# Patient Record
Sex: Female | Born: 1950 | Race: White | Hispanic: No | Marital: Single | Smoking: Never smoker
Health system: Southern US, Community
[De-identification: ages and names within clinical notes are randomized; demographics above are authoritative.]

## PROBLEM LIST (undated history)

## (undated) DIAGNOSIS — K5190 Ulcerative colitis, unspecified, without complications: Secondary | ICD-10-CM

## (undated) DIAGNOSIS — C44721 Squamous cell carcinoma of skin of unspecified lower limb, including hip: Secondary | ICD-10-CM

## (undated) HISTORY — DX: Ulcerative colitis, unspecified, without complications: K51.90

## (undated) HISTORY — DX: Squamous cell carcinoma of skin of unspecified lower limb, including hip: C44.721

## (undated) HISTORY — PX: CHOLECYSTECTOMY: SHX55

## (undated) HISTORY — PX: BREAST EXCISIONAL BIOPSY: SUR124

## (undated) HISTORY — PX: TONSILLECTOMY: SUR1361

## (undated) HISTORY — PX: EYE SURGERY: SHX253

---

## 2005-08-11 ENCOUNTER — Ambulatory Visit: Payer: Self-pay | Admitting: Cardiology

## 2015-08-04 DIAGNOSIS — C44219 Basal cell carcinoma of skin of left ear and external auricular canal: Secondary | ICD-10-CM | POA: Diagnosis not present

## 2015-08-21 DIAGNOSIS — R197 Diarrhea, unspecified: Secondary | ICD-10-CM | POA: Diagnosis not present

## 2015-08-21 DIAGNOSIS — R198 Other specified symptoms and signs involving the digestive system and abdomen: Secondary | ICD-10-CM | POA: Diagnosis not present

## 2015-08-21 DIAGNOSIS — R109 Unspecified abdominal pain: Secondary | ICD-10-CM | POA: Diagnosis not present

## 2015-08-24 DIAGNOSIS — R197 Diarrhea, unspecified: Secondary | ICD-10-CM | POA: Diagnosis not present

## 2015-08-24 DIAGNOSIS — R198 Other specified symptoms and signs involving the digestive system and abdomen: Secondary | ICD-10-CM | POA: Diagnosis not present

## 2015-08-28 DIAGNOSIS — R198 Other specified symptoms and signs involving the digestive system and abdomen: Secondary | ICD-10-CM | POA: Diagnosis not present

## 2015-08-28 DIAGNOSIS — R197 Diarrhea, unspecified: Secondary | ICD-10-CM | POA: Diagnosis not present

## 2015-08-28 DIAGNOSIS — R109 Unspecified abdominal pain: Secondary | ICD-10-CM | POA: Diagnosis not present

## 2015-09-05 DIAGNOSIS — Z881 Allergy status to other antibiotic agents status: Secondary | ICD-10-CM | POA: Diagnosis not present

## 2015-09-05 DIAGNOSIS — R197 Diarrhea, unspecified: Secondary | ICD-10-CM | POA: Diagnosis not present

## 2015-09-08 DIAGNOSIS — R197 Diarrhea, unspecified: Secondary | ICD-10-CM | POA: Diagnosis not present

## 2015-09-08 DIAGNOSIS — R42 Dizziness and giddiness: Secondary | ICD-10-CM | POA: Diagnosis not present

## 2015-09-08 DIAGNOSIS — Z299 Encounter for prophylactic measures, unspecified: Secondary | ICD-10-CM | POA: Diagnosis not present

## 2015-09-08 DIAGNOSIS — M249 Joint derangement, unspecified: Secondary | ICD-10-CM | POA: Diagnosis not present

## 2015-09-08 DIAGNOSIS — Z6823 Body mass index (BMI) 23.0-23.9, adult: Secondary | ICD-10-CM | POA: Diagnosis not present

## 2015-09-08 DIAGNOSIS — Z789 Other specified health status: Secondary | ICD-10-CM | POA: Diagnosis not present

## 2015-09-18 DIAGNOSIS — R109 Unspecified abdominal pain: Secondary | ICD-10-CM | POA: Diagnosis not present

## 2015-09-18 DIAGNOSIS — R197 Diarrhea, unspecified: Secondary | ICD-10-CM | POA: Diagnosis not present

## 2015-09-18 DIAGNOSIS — R748 Abnormal levels of other serum enzymes: Secondary | ICD-10-CM | POA: Diagnosis not present

## 2015-09-18 DIAGNOSIS — R198 Other specified symptoms and signs involving the digestive system and abdomen: Secondary | ICD-10-CM | POA: Diagnosis not present

## 2015-09-28 DIAGNOSIS — R109 Unspecified abdominal pain: Secondary | ICD-10-CM | POA: Diagnosis not present

## 2015-09-29 DIAGNOSIS — E7800 Pure hypercholesterolemia, unspecified: Secondary | ICD-10-CM | POA: Diagnosis not present

## 2015-09-29 DIAGNOSIS — N390 Urinary tract infection, site not specified: Secondary | ICD-10-CM | POA: Diagnosis not present

## 2015-09-29 DIAGNOSIS — Z299 Encounter for prophylactic measures, unspecified: Secondary | ICD-10-CM | POA: Diagnosis not present

## 2015-09-29 DIAGNOSIS — R197 Diarrhea, unspecified: Secondary | ICD-10-CM | POA: Diagnosis not present

## 2015-10-05 DIAGNOSIS — N200 Calculus of kidney: Secondary | ICD-10-CM | POA: Diagnosis not present

## 2015-10-05 DIAGNOSIS — Z87440 Personal history of urinary (tract) infections: Secondary | ICD-10-CM | POA: Diagnosis not present

## 2015-10-05 DIAGNOSIS — N281 Cyst of kidney, acquired: Secondary | ICD-10-CM | POA: Diagnosis not present

## 2015-10-21 DIAGNOSIS — Z789 Other specified health status: Secondary | ICD-10-CM | POA: Diagnosis not present

## 2015-10-21 DIAGNOSIS — R197 Diarrhea, unspecified: Secondary | ICD-10-CM | POA: Diagnosis not present

## 2015-10-21 DIAGNOSIS — J019 Acute sinusitis, unspecified: Secondary | ICD-10-CM | POA: Diagnosis not present

## 2015-10-21 DIAGNOSIS — Z299 Encounter for prophylactic measures, unspecified: Secondary | ICD-10-CM | POA: Diagnosis not present

## 2015-11-11 DIAGNOSIS — Z01419 Encounter for gynecological examination (general) (routine) without abnormal findings: Secondary | ICD-10-CM | POA: Diagnosis not present

## 2015-11-11 DIAGNOSIS — Z6824 Body mass index (BMI) 24.0-24.9, adult: Secondary | ICD-10-CM | POA: Diagnosis not present

## 2015-11-19 DIAGNOSIS — Z1231 Encounter for screening mammogram for malignant neoplasm of breast: Secondary | ICD-10-CM | POA: Diagnosis not present

## 2015-12-31 DIAGNOSIS — C44311 Basal cell carcinoma of skin of nose: Secondary | ICD-10-CM | POA: Diagnosis not present

## 2016-02-25 DIAGNOSIS — Z85828 Personal history of other malignant neoplasm of skin: Secondary | ICD-10-CM | POA: Diagnosis not present

## 2016-02-25 DIAGNOSIS — L814 Other melanin hyperpigmentation: Secondary | ICD-10-CM | POA: Diagnosis not present

## 2016-02-25 DIAGNOSIS — L821 Other seborrheic keratosis: Secondary | ICD-10-CM | POA: Diagnosis not present

## 2016-02-25 DIAGNOSIS — L570 Actinic keratosis: Secondary | ICD-10-CM | POA: Diagnosis not present

## 2016-02-25 DIAGNOSIS — D1801 Hemangioma of skin and subcutaneous tissue: Secondary | ICD-10-CM | POA: Diagnosis not present

## 2016-03-15 DIAGNOSIS — R93422 Abnormal radiologic findings on diagnostic imaging of left kidney: Secondary | ICD-10-CM | POA: Diagnosis not present

## 2016-04-05 DIAGNOSIS — J329 Chronic sinusitis, unspecified: Secondary | ICD-10-CM | POA: Diagnosis not present

## 2016-04-05 DIAGNOSIS — E7800 Pure hypercholesterolemia, unspecified: Secondary | ICD-10-CM | POA: Diagnosis not present

## 2016-04-05 DIAGNOSIS — Z299 Encounter for prophylactic measures, unspecified: Secondary | ICD-10-CM | POA: Diagnosis not present

## 2016-04-11 DIAGNOSIS — R351 Nocturia: Secondary | ICD-10-CM | POA: Diagnosis not present

## 2016-04-11 DIAGNOSIS — D49512 Neoplasm of unspecified behavior of left kidney: Secondary | ICD-10-CM | POA: Diagnosis not present

## 2016-04-11 DIAGNOSIS — R3129 Other microscopic hematuria: Secondary | ICD-10-CM | POA: Diagnosis not present

## 2016-04-13 DIAGNOSIS — H9313 Tinnitus, bilateral: Secondary | ICD-10-CM | POA: Diagnosis not present

## 2016-04-13 DIAGNOSIS — Z23 Encounter for immunization: Secondary | ICD-10-CM | POA: Diagnosis not present

## 2016-04-13 DIAGNOSIS — H9312 Tinnitus, left ear: Secondary | ICD-10-CM | POA: Diagnosis not present

## 2016-04-13 DIAGNOSIS — H903 Sensorineural hearing loss, bilateral: Secondary | ICD-10-CM | POA: Diagnosis not present

## 2016-06-07 DIAGNOSIS — Z0000 Encounter for general adult medical examination without abnormal findings: Secondary | ICD-10-CM | POA: Diagnosis not present

## 2016-06-07 DIAGNOSIS — Z7189 Other specified counseling: Secondary | ICD-10-CM | POA: Diagnosis not present

## 2016-06-07 DIAGNOSIS — Z1389 Encounter for screening for other disorder: Secondary | ICD-10-CM | POA: Diagnosis not present

## 2016-06-07 DIAGNOSIS — E559 Vitamin D deficiency, unspecified: Secondary | ICD-10-CM | POA: Diagnosis not present

## 2016-06-07 DIAGNOSIS — Z1211 Encounter for screening for malignant neoplasm of colon: Secondary | ICD-10-CM | POA: Diagnosis not present

## 2016-06-07 DIAGNOSIS — R5383 Other fatigue: Secondary | ICD-10-CM | POA: Diagnosis not present

## 2016-06-07 DIAGNOSIS — E7800 Pure hypercholesterolemia, unspecified: Secondary | ICD-10-CM | POA: Diagnosis not present

## 2016-06-07 DIAGNOSIS — Z6824 Body mass index (BMI) 24.0-24.9, adult: Secondary | ICD-10-CM | POA: Diagnosis not present

## 2016-06-07 DIAGNOSIS — Z299 Encounter for prophylactic measures, unspecified: Secondary | ICD-10-CM | POA: Diagnosis not present

## 2016-06-24 DIAGNOSIS — K529 Noninfective gastroenteritis and colitis, unspecified: Secondary | ICD-10-CM | POA: Diagnosis not present

## 2016-06-24 DIAGNOSIS — K625 Hemorrhage of anus and rectum: Secondary | ICD-10-CM | POA: Diagnosis not present

## 2016-06-27 DIAGNOSIS — E7800 Pure hypercholesterolemia, unspecified: Secondary | ICD-10-CM | POA: Diagnosis not present

## 2016-06-27 DIAGNOSIS — R197 Diarrhea, unspecified: Secondary | ICD-10-CM | POA: Diagnosis not present

## 2016-06-27 DIAGNOSIS — J111 Influenza due to unidentified influenza virus with other respiratory manifestations: Secondary | ICD-10-CM | POA: Diagnosis not present

## 2016-06-27 DIAGNOSIS — R509 Fever, unspecified: Secondary | ICD-10-CM | POA: Diagnosis not present

## 2016-06-27 DIAGNOSIS — Z789 Other specified health status: Secondary | ICD-10-CM | POA: Diagnosis not present

## 2016-06-27 DIAGNOSIS — Z299 Encounter for prophylactic measures, unspecified: Secondary | ICD-10-CM | POA: Diagnosis not present

## 2016-06-27 DIAGNOSIS — Z6824 Body mass index (BMI) 24.0-24.9, adult: Secondary | ICD-10-CM | POA: Diagnosis not present

## 2016-07-11 DIAGNOSIS — K5289 Other specified noninfective gastroenteritis and colitis: Secondary | ICD-10-CM | POA: Diagnosis not present

## 2016-07-11 DIAGNOSIS — K529 Noninfective gastroenteritis and colitis, unspecified: Secondary | ICD-10-CM | POA: Diagnosis not present

## 2016-07-11 DIAGNOSIS — K625 Hemorrhage of anus and rectum: Secondary | ICD-10-CM | POA: Diagnosis not present

## 2016-07-12 DIAGNOSIS — E2839 Other primary ovarian failure: Secondary | ICD-10-CM | POA: Diagnosis not present

## 2016-07-14 DIAGNOSIS — Z789 Other specified health status: Secondary | ICD-10-CM | POA: Diagnosis not present

## 2016-07-14 DIAGNOSIS — Z299 Encounter for prophylactic measures, unspecified: Secondary | ICD-10-CM | POA: Diagnosis not present

## 2016-07-14 DIAGNOSIS — Z6824 Body mass index (BMI) 24.0-24.9, adult: Secondary | ICD-10-CM | POA: Diagnosis not present

## 2016-07-14 DIAGNOSIS — R197 Diarrhea, unspecified: Secondary | ICD-10-CM | POA: Diagnosis not present

## 2016-07-14 DIAGNOSIS — Z713 Dietary counseling and surveillance: Secondary | ICD-10-CM | POA: Diagnosis not present

## 2016-07-14 DIAGNOSIS — E7800 Pure hypercholesterolemia, unspecified: Secondary | ICD-10-CM | POA: Diagnosis not present

## 2016-07-19 DIAGNOSIS — Z800 Family history of malignant neoplasm of digestive organs: Secondary | ICD-10-CM | POA: Diagnosis not present

## 2016-07-19 DIAGNOSIS — K625 Hemorrhage of anus and rectum: Secondary | ICD-10-CM | POA: Diagnosis not present

## 2016-07-19 DIAGNOSIS — K529 Noninfective gastroenteritis and colitis, unspecified: Secondary | ICD-10-CM | POA: Diagnosis not present

## 2016-07-25 DIAGNOSIS — E7800 Pure hypercholesterolemia, unspecified: Secondary | ICD-10-CM | POA: Diagnosis not present

## 2016-07-25 DIAGNOSIS — Z789 Other specified health status: Secondary | ICD-10-CM | POA: Diagnosis not present

## 2016-07-25 DIAGNOSIS — K529 Noninfective gastroenteritis and colitis, unspecified: Secondary | ICD-10-CM | POA: Diagnosis not present

## 2016-07-25 DIAGNOSIS — Z299 Encounter for prophylactic measures, unspecified: Secondary | ICD-10-CM | POA: Diagnosis not present

## 2016-07-25 DIAGNOSIS — Z6823 Body mass index (BMI) 23.0-23.9, adult: Secondary | ICD-10-CM | POA: Diagnosis not present

## 2016-07-25 DIAGNOSIS — R197 Diarrhea, unspecified: Secondary | ICD-10-CM | POA: Diagnosis not present

## 2016-08-11 DIAGNOSIS — Z6823 Body mass index (BMI) 23.0-23.9, adult: Secondary | ICD-10-CM | POA: Diagnosis not present

## 2016-08-11 DIAGNOSIS — E7800 Pure hypercholesterolemia, unspecified: Secondary | ICD-10-CM | POA: Diagnosis not present

## 2016-08-11 DIAGNOSIS — Z789 Other specified health status: Secondary | ICD-10-CM | POA: Diagnosis not present

## 2016-08-11 DIAGNOSIS — J069 Acute upper respiratory infection, unspecified: Secondary | ICD-10-CM | POA: Diagnosis not present

## 2016-08-23 DIAGNOSIS — Z9049 Acquired absence of other specified parts of digestive tract: Secondary | ICD-10-CM | POA: Diagnosis not present

## 2016-08-23 DIAGNOSIS — Z800 Family history of malignant neoplasm of digestive organs: Secondary | ICD-10-CM | POA: Diagnosis not present

## 2016-08-23 DIAGNOSIS — K529 Noninfective gastroenteritis and colitis, unspecified: Secondary | ICD-10-CM | POA: Diagnosis not present

## 2016-08-25 DIAGNOSIS — L821 Other seborrheic keratosis: Secondary | ICD-10-CM | POA: Diagnosis not present

## 2016-08-25 DIAGNOSIS — L814 Other melanin hyperpigmentation: Secondary | ICD-10-CM | POA: Diagnosis not present

## 2016-08-25 DIAGNOSIS — D1801 Hemangioma of skin and subcutaneous tissue: Secondary | ICD-10-CM | POA: Diagnosis not present

## 2016-08-25 DIAGNOSIS — Z85828 Personal history of other malignant neoplasm of skin: Secondary | ICD-10-CM | POA: Diagnosis not present

## 2016-08-25 DIAGNOSIS — L570 Actinic keratosis: Secondary | ICD-10-CM | POA: Diagnosis not present

## 2016-09-13 DIAGNOSIS — K5190 Ulcerative colitis, unspecified, without complications: Secondary | ICD-10-CM | POA: Diagnosis not present

## 2016-09-13 DIAGNOSIS — R197 Diarrhea, unspecified: Secondary | ICD-10-CM | POA: Diagnosis not present

## 2016-09-13 DIAGNOSIS — Z6823 Body mass index (BMI) 23.0-23.9, adult: Secondary | ICD-10-CM | POA: Diagnosis not present

## 2016-09-13 DIAGNOSIS — E7800 Pure hypercholesterolemia, unspecified: Secondary | ICD-10-CM | POA: Diagnosis not present

## 2016-09-13 DIAGNOSIS — Z299 Encounter for prophylactic measures, unspecified: Secondary | ICD-10-CM | POA: Diagnosis not present

## 2016-09-13 DIAGNOSIS — Z789 Other specified health status: Secondary | ICD-10-CM | POA: Diagnosis not present

## 2016-10-13 DIAGNOSIS — H524 Presbyopia: Secondary | ICD-10-CM | POA: Diagnosis not present

## 2016-10-13 DIAGNOSIS — H2513 Age-related nuclear cataract, bilateral: Secondary | ICD-10-CM | POA: Diagnosis not present

## 2016-11-16 DIAGNOSIS — Z01419 Encounter for gynecological examination (general) (routine) without abnormal findings: Secondary | ICD-10-CM | POA: Diagnosis not present

## 2016-11-16 DIAGNOSIS — N951 Menopausal and female climacteric states: Secondary | ICD-10-CM | POA: Diagnosis not present

## 2016-11-16 DIAGNOSIS — Z6823 Body mass index (BMI) 23.0-23.9, adult: Secondary | ICD-10-CM | POA: Diagnosis not present

## 2016-11-16 DIAGNOSIS — Z87898 Personal history of other specified conditions: Secondary | ICD-10-CM | POA: Diagnosis not present

## 2016-11-16 DIAGNOSIS — N3090 Cystitis, unspecified without hematuria: Secondary | ICD-10-CM | POA: Diagnosis not present

## 2016-12-15 DIAGNOSIS — Z79899 Other long term (current) drug therapy: Secondary | ICD-10-CM | POA: Diagnosis not present

## 2016-12-15 DIAGNOSIS — Z299 Encounter for prophylactic measures, unspecified: Secondary | ICD-10-CM | POA: Diagnosis not present

## 2016-12-15 DIAGNOSIS — K5190 Ulcerative colitis, unspecified, without complications: Secondary | ICD-10-CM | POA: Diagnosis not present

## 2016-12-15 DIAGNOSIS — E7800 Pure hypercholesterolemia, unspecified: Secondary | ICD-10-CM | POA: Diagnosis not present

## 2016-12-15 DIAGNOSIS — Z6823 Body mass index (BMI) 23.0-23.9, adult: Secondary | ICD-10-CM | POA: Diagnosis not present

## 2016-12-15 DIAGNOSIS — I8390 Asymptomatic varicose veins of unspecified lower extremity: Secondary | ICD-10-CM | POA: Diagnosis not present

## 2017-01-04 DIAGNOSIS — J309 Allergic rhinitis, unspecified: Secondary | ICD-10-CM | POA: Diagnosis not present

## 2017-01-04 DIAGNOSIS — H65112 Acute and subacute allergic otitis media (mucoid) (sanguinous) (serous), left ear: Secondary | ICD-10-CM | POA: Diagnosis not present

## 2017-01-04 DIAGNOSIS — H9202 Otalgia, left ear: Secondary | ICD-10-CM | POA: Diagnosis not present

## 2017-02-02 DIAGNOSIS — D485 Neoplasm of uncertain behavior of skin: Secondary | ICD-10-CM | POA: Diagnosis not present

## 2017-02-02 DIAGNOSIS — C44722 Squamous cell carcinoma of skin of right lower limb, including hip: Secondary | ICD-10-CM | POA: Diagnosis not present

## 2017-02-04 DIAGNOSIS — R300 Dysuria: Secondary | ICD-10-CM | POA: Diagnosis not present

## 2017-02-04 DIAGNOSIS — N390 Urinary tract infection, site not specified: Secondary | ICD-10-CM | POA: Diagnosis not present

## 2017-02-16 DIAGNOSIS — Z1211 Encounter for screening for malignant neoplasm of colon: Secondary | ICD-10-CM | POA: Diagnosis not present

## 2017-02-16 DIAGNOSIS — D500 Iron deficiency anemia secondary to blood loss (chronic): Secondary | ICD-10-CM | POA: Diagnosis not present

## 2017-02-16 DIAGNOSIS — Z800 Family history of malignant neoplasm of digestive organs: Secondary | ICD-10-CM | POA: Diagnosis not present

## 2017-02-16 DIAGNOSIS — K529 Noninfective gastroenteritis and colitis, unspecified: Secondary | ICD-10-CM | POA: Diagnosis not present

## 2017-02-21 DIAGNOSIS — D1801 Hemangioma of skin and subcutaneous tissue: Secondary | ICD-10-CM | POA: Diagnosis not present

## 2017-02-21 DIAGNOSIS — Z85828 Personal history of other malignant neoplasm of skin: Secondary | ICD-10-CM | POA: Diagnosis not present

## 2017-02-21 DIAGNOSIS — L821 Other seborrheic keratosis: Secondary | ICD-10-CM | POA: Diagnosis not present

## 2017-02-21 DIAGNOSIS — L570 Actinic keratosis: Secondary | ICD-10-CM | POA: Diagnosis not present

## 2017-02-21 DIAGNOSIS — L814 Other melanin hyperpigmentation: Secondary | ICD-10-CM | POA: Diagnosis not present

## 2017-03-07 DIAGNOSIS — L905 Scar conditions and fibrosis of skin: Secondary | ICD-10-CM | POA: Diagnosis not present

## 2017-03-07 DIAGNOSIS — C44722 Squamous cell carcinoma of skin of right lower limb, including hip: Secondary | ICD-10-CM | POA: Diagnosis not present

## 2017-03-25 DIAGNOSIS — L0390 Cellulitis, unspecified: Secondary | ICD-10-CM | POA: Diagnosis not present

## 2017-03-30 DIAGNOSIS — Z23 Encounter for immunization: Secondary | ICD-10-CM | POA: Diagnosis not present

## 2017-04-12 DIAGNOSIS — N281 Cyst of kidney, acquired: Secondary | ICD-10-CM | POA: Diagnosis not present

## 2017-04-13 DIAGNOSIS — N281 Cyst of kidney, acquired: Secondary | ICD-10-CM | POA: Diagnosis not present

## 2017-05-16 DIAGNOSIS — K5190 Ulcerative colitis, unspecified, without complications: Secondary | ICD-10-CM | POA: Diagnosis not present

## 2017-06-08 DIAGNOSIS — Z79899 Other long term (current) drug therapy: Secondary | ICD-10-CM | POA: Diagnosis not present

## 2017-06-08 DIAGNOSIS — Z1331 Encounter for screening for depression: Secondary | ICD-10-CM | POA: Diagnosis not present

## 2017-06-08 DIAGNOSIS — E7800 Pure hypercholesterolemia, unspecified: Secondary | ICD-10-CM | POA: Diagnosis not present

## 2017-06-08 DIAGNOSIS — Z299 Encounter for prophylactic measures, unspecified: Secondary | ICD-10-CM | POA: Diagnosis not present

## 2017-06-08 DIAGNOSIS — Z6824 Body mass index (BMI) 24.0-24.9, adult: Secondary | ICD-10-CM | POA: Diagnosis not present

## 2017-06-08 DIAGNOSIS — R5383 Other fatigue: Secondary | ICD-10-CM | POA: Diagnosis not present

## 2017-06-08 DIAGNOSIS — Z0000 Encounter for general adult medical examination without abnormal findings: Secondary | ICD-10-CM | POA: Diagnosis not present

## 2017-06-08 DIAGNOSIS — Z1211 Encounter for screening for malignant neoplasm of colon: Secondary | ICD-10-CM | POA: Diagnosis not present

## 2017-06-08 DIAGNOSIS — Z7189 Other specified counseling: Secondary | ICD-10-CM | POA: Diagnosis not present

## 2017-06-08 DIAGNOSIS — E559 Vitamin D deficiency, unspecified: Secondary | ICD-10-CM | POA: Diagnosis not present

## 2017-06-08 DIAGNOSIS — Z1339 Encounter for screening examination for other mental health and behavioral disorders: Secondary | ICD-10-CM | POA: Diagnosis not present

## 2017-08-21 DIAGNOSIS — Z85828 Personal history of other malignant neoplasm of skin: Secondary | ICD-10-CM | POA: Diagnosis not present

## 2017-08-21 DIAGNOSIS — L821 Other seborrheic keratosis: Secondary | ICD-10-CM | POA: Diagnosis not present

## 2017-08-21 DIAGNOSIS — D225 Melanocytic nevi of trunk: Secondary | ICD-10-CM | POA: Diagnosis not present

## 2017-08-21 DIAGNOSIS — L814 Other melanin hyperpigmentation: Secondary | ICD-10-CM | POA: Diagnosis not present

## 2017-08-21 DIAGNOSIS — L570 Actinic keratosis: Secondary | ICD-10-CM | POA: Diagnosis not present

## 2017-08-21 DIAGNOSIS — D485 Neoplasm of uncertain behavior of skin: Secondary | ICD-10-CM | POA: Diagnosis not present

## 2017-09-05 DIAGNOSIS — L570 Actinic keratosis: Secondary | ICD-10-CM | POA: Diagnosis not present

## 2017-09-05 DIAGNOSIS — L298 Other pruritus: Secondary | ICD-10-CM | POA: Diagnosis not present

## 2017-09-06 DIAGNOSIS — M25571 Pain in right ankle and joints of right foot: Secondary | ICD-10-CM | POA: Diagnosis not present

## 2017-10-12 DIAGNOSIS — H8309 Labyrinthitis, unspecified ear: Secondary | ICD-10-CM | POA: Diagnosis not present

## 2017-10-12 DIAGNOSIS — K5190 Ulcerative colitis, unspecified, without complications: Secondary | ICD-10-CM | POA: Diagnosis not present

## 2017-10-12 DIAGNOSIS — Z299 Encounter for prophylactic measures, unspecified: Secondary | ICD-10-CM | POA: Diagnosis not present

## 2017-10-12 DIAGNOSIS — W57XXXA Bitten or stung by nonvenomous insect and other nonvenomous arthropods, initial encounter: Secondary | ICD-10-CM | POA: Diagnosis not present

## 2017-10-12 DIAGNOSIS — Z6823 Body mass index (BMI) 23.0-23.9, adult: Secondary | ICD-10-CM | POA: Diagnosis not present

## 2017-10-12 DIAGNOSIS — Z789 Other specified health status: Secondary | ICD-10-CM | POA: Diagnosis not present

## 2017-10-16 DIAGNOSIS — H524 Presbyopia: Secondary | ICD-10-CM | POA: Diagnosis not present

## 2017-10-16 DIAGNOSIS — H2513 Age-related nuclear cataract, bilateral: Secondary | ICD-10-CM | POA: Diagnosis not present

## 2017-11-07 DIAGNOSIS — L309 Dermatitis, unspecified: Secondary | ICD-10-CM | POA: Diagnosis not present

## 2017-11-07 DIAGNOSIS — Q828 Other specified congenital malformations of skin: Secondary | ICD-10-CM | POA: Diagnosis not present

## 2017-11-09 DIAGNOSIS — H2511 Age-related nuclear cataract, right eye: Secondary | ICD-10-CM | POA: Diagnosis not present

## 2017-11-09 DIAGNOSIS — H2512 Age-related nuclear cataract, left eye: Secondary | ICD-10-CM | POA: Diagnosis not present

## 2017-12-12 DIAGNOSIS — R350 Frequency of micturition: Secondary | ICD-10-CM | POA: Diagnosis not present

## 2017-12-12 DIAGNOSIS — N390 Urinary tract infection, site not specified: Secondary | ICD-10-CM | POA: Diagnosis not present

## 2017-12-12 DIAGNOSIS — Z6823 Body mass index (BMI) 23.0-23.9, adult: Secondary | ICD-10-CM | POA: Diagnosis not present

## 2017-12-12 DIAGNOSIS — K5190 Ulcerative colitis, unspecified, without complications: Secondary | ICD-10-CM | POA: Diagnosis not present

## 2017-12-12 DIAGNOSIS — Z299 Encounter for prophylactic measures, unspecified: Secondary | ICD-10-CM | POA: Diagnosis not present

## 2017-12-22 DIAGNOSIS — Z87440 Personal history of urinary (tract) infections: Secondary | ICD-10-CM | POA: Diagnosis not present

## 2017-12-22 DIAGNOSIS — R300 Dysuria: Secondary | ICD-10-CM | POA: Diagnosis not present

## 2018-01-03 DIAGNOSIS — H2512 Age-related nuclear cataract, left eye: Secondary | ICD-10-CM | POA: Diagnosis not present

## 2018-01-03 DIAGNOSIS — H259 Unspecified age-related cataract: Secondary | ICD-10-CM | POA: Diagnosis not present

## 2018-01-04 DIAGNOSIS — H2511 Age-related nuclear cataract, right eye: Secondary | ICD-10-CM | POA: Diagnosis not present

## 2018-01-04 DIAGNOSIS — Z961 Presence of intraocular lens: Secondary | ICD-10-CM | POA: Diagnosis not present

## 2018-01-17 DIAGNOSIS — H2511 Age-related nuclear cataract, right eye: Secondary | ICD-10-CM | POA: Diagnosis not present

## 2018-01-17 DIAGNOSIS — H259 Unspecified age-related cataract: Secondary | ICD-10-CM | POA: Diagnosis not present

## 2018-02-12 DIAGNOSIS — Z23 Encounter for immunization: Secondary | ICD-10-CM | POA: Diagnosis not present

## 2018-02-19 DIAGNOSIS — M25561 Pain in right knee: Secondary | ICD-10-CM | POA: Diagnosis not present

## 2018-03-27 DIAGNOSIS — Z85828 Personal history of other malignant neoplasm of skin: Secondary | ICD-10-CM | POA: Diagnosis not present

## 2018-03-27 DIAGNOSIS — L814 Other melanin hyperpigmentation: Secondary | ICD-10-CM | POA: Diagnosis not present

## 2018-03-27 DIAGNOSIS — L570 Actinic keratosis: Secondary | ICD-10-CM | POA: Diagnosis not present

## 2018-03-27 DIAGNOSIS — B351 Tinea unguium: Secondary | ICD-10-CM | POA: Diagnosis not present

## 2018-04-12 DIAGNOSIS — N281 Cyst of kidney, acquired: Secondary | ICD-10-CM | POA: Diagnosis not present

## 2018-04-25 DIAGNOSIS — N281 Cyst of kidney, acquired: Secondary | ICD-10-CM | POA: Diagnosis not present

## 2018-05-01 DIAGNOSIS — M79674 Pain in right toe(s): Secondary | ICD-10-CM | POA: Diagnosis not present

## 2018-05-01 DIAGNOSIS — M2041 Other hammer toe(s) (acquired), right foot: Secondary | ICD-10-CM | POA: Diagnosis not present

## 2018-05-01 DIAGNOSIS — M2042 Other hammer toe(s) (acquired), left foot: Secondary | ICD-10-CM | POA: Diagnosis not present

## 2018-05-08 DIAGNOSIS — Z961 Presence of intraocular lens: Secondary | ICD-10-CM | POA: Diagnosis not present

## 2018-05-08 DIAGNOSIS — H04123 Dry eye syndrome of bilateral lacrimal glands: Secondary | ICD-10-CM | POA: Diagnosis not present

## 2018-06-11 DIAGNOSIS — Z1211 Encounter for screening for malignant neoplasm of colon: Secondary | ICD-10-CM | POA: Diagnosis not present

## 2018-06-11 DIAGNOSIS — Z79899 Other long term (current) drug therapy: Secondary | ICD-10-CM | POA: Diagnosis not present

## 2018-06-11 DIAGNOSIS — Z6827 Body mass index (BMI) 27.0-27.9, adult: Secondary | ICD-10-CM | POA: Diagnosis not present

## 2018-06-11 DIAGNOSIS — R5383 Other fatigue: Secondary | ICD-10-CM | POA: Diagnosis not present

## 2018-06-11 DIAGNOSIS — Z0000 Encounter for general adult medical examination without abnormal findings: Secondary | ICD-10-CM | POA: Diagnosis not present

## 2018-06-11 DIAGNOSIS — Z1331 Encounter for screening for depression: Secondary | ICD-10-CM | POA: Diagnosis not present

## 2018-06-11 DIAGNOSIS — E559 Vitamin D deficiency, unspecified: Secondary | ICD-10-CM | POA: Diagnosis not present

## 2018-06-11 DIAGNOSIS — K5190 Ulcerative colitis, unspecified, without complications: Secondary | ICD-10-CM | POA: Diagnosis not present

## 2018-06-11 DIAGNOSIS — E7800 Pure hypercholesterolemia, unspecified: Secondary | ICD-10-CM | POA: Diagnosis not present

## 2018-06-11 DIAGNOSIS — Z7189 Other specified counseling: Secondary | ICD-10-CM | POA: Diagnosis not present

## 2018-06-11 DIAGNOSIS — Z299 Encounter for prophylactic measures, unspecified: Secondary | ICD-10-CM | POA: Diagnosis not present

## 2018-06-12 DIAGNOSIS — L603 Nail dystrophy: Secondary | ICD-10-CM | POA: Diagnosis not present

## 2018-06-12 DIAGNOSIS — M79674 Pain in right toe(s): Secondary | ICD-10-CM | POA: Diagnosis not present

## 2018-06-19 DIAGNOSIS — M1711 Unilateral primary osteoarthritis, right knee: Secondary | ICD-10-CM | POA: Diagnosis not present

## 2018-06-19 DIAGNOSIS — Z299 Encounter for prophylactic measures, unspecified: Secondary | ICD-10-CM | POA: Diagnosis not present

## 2018-06-19 DIAGNOSIS — Z6827 Body mass index (BMI) 27.0-27.9, adult: Secondary | ICD-10-CM | POA: Diagnosis not present

## 2018-06-19 DIAGNOSIS — Z789 Other specified health status: Secondary | ICD-10-CM | POA: Diagnosis not present

## 2018-08-14 DIAGNOSIS — M79674 Pain in right toe(s): Secondary | ICD-10-CM | POA: Diagnosis not present

## 2018-08-14 DIAGNOSIS — L603 Nail dystrophy: Secondary | ICD-10-CM | POA: Diagnosis not present

## 2018-11-14 DIAGNOSIS — L603 Nail dystrophy: Secondary | ICD-10-CM | POA: Diagnosis not present

## 2018-11-14 DIAGNOSIS — M79674 Pain in right toe(s): Secondary | ICD-10-CM | POA: Diagnosis not present

## 2019-03-06 DIAGNOSIS — Z23 Encounter for immunization: Secondary | ICD-10-CM | POA: Diagnosis not present

## 2019-04-05 DIAGNOSIS — Z85828 Personal history of other malignant neoplasm of skin: Secondary | ICD-10-CM | POA: Diagnosis not present

## 2019-04-05 DIAGNOSIS — D485 Neoplasm of uncertain behavior of skin: Secondary | ICD-10-CM | POA: Diagnosis not present

## 2019-04-05 DIAGNOSIS — C44729 Squamous cell carcinoma of skin of left lower limb, including hip: Secondary | ICD-10-CM | POA: Diagnosis not present

## 2019-04-05 DIAGNOSIS — D1801 Hemangioma of skin and subcutaneous tissue: Secondary | ICD-10-CM | POA: Diagnosis not present

## 2019-04-05 DIAGNOSIS — L821 Other seborrheic keratosis: Secondary | ICD-10-CM | POA: Diagnosis not present

## 2019-04-05 DIAGNOSIS — L570 Actinic keratosis: Secondary | ICD-10-CM | POA: Diagnosis not present

## 2019-04-15 DIAGNOSIS — N281 Cyst of kidney, acquired: Secondary | ICD-10-CM | POA: Diagnosis not present

## 2019-05-06 DIAGNOSIS — C44729 Squamous cell carcinoma of skin of left lower limb, including hip: Secondary | ICD-10-CM | POA: Diagnosis not present

## 2019-05-07 DIAGNOSIS — H35373 Puckering of macula, bilateral: Secondary | ICD-10-CM | POA: Diagnosis not present

## 2019-05-07 DIAGNOSIS — H26493 Other secondary cataract, bilateral: Secondary | ICD-10-CM | POA: Diagnosis not present

## 2019-06-19 DIAGNOSIS — Z79899 Other long term (current) drug therapy: Secondary | ICD-10-CM | POA: Diagnosis not present

## 2019-06-19 DIAGNOSIS — Z1339 Encounter for screening examination for other mental health and behavioral disorders: Secondary | ICD-10-CM | POA: Diagnosis not present

## 2019-06-19 DIAGNOSIS — Z7189 Other specified counseling: Secondary | ICD-10-CM | POA: Diagnosis not present

## 2019-06-19 DIAGNOSIS — Z1331 Encounter for screening for depression: Secondary | ICD-10-CM | POA: Diagnosis not present

## 2019-06-19 DIAGNOSIS — R5383 Other fatigue: Secondary | ICD-10-CM | POA: Diagnosis not present

## 2019-06-19 DIAGNOSIS — R202 Paresthesia of skin: Secondary | ICD-10-CM | POA: Diagnosis not present

## 2019-06-19 DIAGNOSIS — K5190 Ulcerative colitis, unspecified, without complications: Secondary | ICD-10-CM | POA: Diagnosis not present

## 2019-06-19 DIAGNOSIS — Z1211 Encounter for screening for malignant neoplasm of colon: Secondary | ICD-10-CM | POA: Diagnosis not present

## 2019-06-19 DIAGNOSIS — E7800 Pure hypercholesterolemia, unspecified: Secondary | ICD-10-CM | POA: Diagnosis not present

## 2019-06-19 DIAGNOSIS — Z6827 Body mass index (BMI) 27.0-27.9, adult: Secondary | ICD-10-CM | POA: Diagnosis not present

## 2019-06-19 DIAGNOSIS — Z299 Encounter for prophylactic measures, unspecified: Secondary | ICD-10-CM | POA: Diagnosis not present

## 2019-06-19 DIAGNOSIS — Z0000 Encounter for general adult medical examination without abnormal findings: Secondary | ICD-10-CM | POA: Diagnosis not present

## 2019-06-19 DIAGNOSIS — E559 Vitamin D deficiency, unspecified: Secondary | ICD-10-CM | POA: Diagnosis not present

## 2019-06-19 DIAGNOSIS — M1710 Unilateral primary osteoarthritis, unspecified knee: Secondary | ICD-10-CM | POA: Diagnosis not present

## 2019-08-08 DIAGNOSIS — Z23 Encounter for immunization: Secondary | ICD-10-CM | POA: Diagnosis not present

## 2019-08-14 DIAGNOSIS — R110 Nausea: Secondary | ICD-10-CM | POA: Diagnosis not present

## 2019-08-14 DIAGNOSIS — B349 Viral infection, unspecified: Secondary | ICD-10-CM | POA: Diagnosis not present

## 2019-09-03 DIAGNOSIS — J302 Other seasonal allergic rhinitis: Secondary | ICD-10-CM | POA: Diagnosis not present

## 2019-09-03 DIAGNOSIS — J019 Acute sinusitis, unspecified: Secondary | ICD-10-CM | POA: Diagnosis not present

## 2019-09-05 DIAGNOSIS — Z23 Encounter for immunization: Secondary | ICD-10-CM | POA: Diagnosis not present

## 2019-09-10 DIAGNOSIS — E2839 Other primary ovarian failure: Secondary | ICD-10-CM | POA: Diagnosis not present

## 2019-09-18 DIAGNOSIS — M25569 Pain in unspecified knee: Secondary | ICD-10-CM | POA: Diagnosis not present

## 2019-09-18 DIAGNOSIS — Z299 Encounter for prophylactic measures, unspecified: Secondary | ICD-10-CM | POA: Diagnosis not present

## 2019-10-04 DIAGNOSIS — L905 Scar conditions and fibrosis of skin: Secondary | ICD-10-CM | POA: Diagnosis not present

## 2019-10-04 DIAGNOSIS — L821 Other seborrheic keratosis: Secondary | ICD-10-CM | POA: Diagnosis not present

## 2019-10-04 DIAGNOSIS — Z85828 Personal history of other malignant neoplasm of skin: Secondary | ICD-10-CM | POA: Diagnosis not present

## 2019-10-04 DIAGNOSIS — D1801 Hemangioma of skin and subcutaneous tissue: Secondary | ICD-10-CM | POA: Diagnosis not present

## 2019-10-04 DIAGNOSIS — D485 Neoplasm of uncertain behavior of skin: Secondary | ICD-10-CM | POA: Diagnosis not present

## 2019-10-04 DIAGNOSIS — L570 Actinic keratosis: Secondary | ICD-10-CM | POA: Diagnosis not present

## 2019-10-04 DIAGNOSIS — L439 Lichen planus, unspecified: Secondary | ICD-10-CM | POA: Diagnosis not present

## 2019-10-17 DIAGNOSIS — K5190 Ulcerative colitis, unspecified, without complications: Secondary | ICD-10-CM | POA: Diagnosis not present

## 2019-10-17 DIAGNOSIS — Z299 Encounter for prophylactic measures, unspecified: Secondary | ICD-10-CM | POA: Diagnosis not present

## 2019-10-17 DIAGNOSIS — L249 Irritant contact dermatitis, unspecified cause: Secondary | ICD-10-CM | POA: Diagnosis not present

## 2020-02-12 DIAGNOSIS — J069 Acute upper respiratory infection, unspecified: Secondary | ICD-10-CM | POA: Diagnosis not present

## 2020-02-12 DIAGNOSIS — R0981 Nasal congestion: Secondary | ICD-10-CM | POA: Diagnosis not present

## 2020-02-18 DIAGNOSIS — K5190 Ulcerative colitis, unspecified, without complications: Secondary | ICD-10-CM | POA: Diagnosis not present

## 2020-02-18 DIAGNOSIS — T63441A Toxic effect of venom of bees, accidental (unintentional), initial encounter: Secondary | ICD-10-CM | POA: Diagnosis not present

## 2020-02-18 DIAGNOSIS — J069 Acute upper respiratory infection, unspecified: Secondary | ICD-10-CM | POA: Diagnosis not present

## 2020-02-18 DIAGNOSIS — M25569 Pain in unspecified knee: Secondary | ICD-10-CM | POA: Diagnosis not present

## 2020-02-18 DIAGNOSIS — Z299 Encounter for prophylactic measures, unspecified: Secondary | ICD-10-CM | POA: Diagnosis not present

## 2020-03-09 DIAGNOSIS — Z6827 Body mass index (BMI) 27.0-27.9, adult: Secondary | ICD-10-CM | POA: Diagnosis not present

## 2020-03-09 DIAGNOSIS — Z299 Encounter for prophylactic measures, unspecified: Secondary | ICD-10-CM | POA: Diagnosis not present

## 2020-03-09 DIAGNOSIS — G629 Polyneuropathy, unspecified: Secondary | ICD-10-CM | POA: Diagnosis not present

## 2020-03-09 DIAGNOSIS — R0602 Shortness of breath: Secondary | ICD-10-CM | POA: Diagnosis not present

## 2020-03-09 DIAGNOSIS — R05 Cough: Secondary | ICD-10-CM | POA: Diagnosis not present

## 2020-03-09 DIAGNOSIS — J320 Chronic maxillary sinusitis: Secondary | ICD-10-CM | POA: Diagnosis not present

## 2020-03-09 DIAGNOSIS — R0981 Nasal congestion: Secondary | ICD-10-CM | POA: Diagnosis not present

## 2020-03-16 DIAGNOSIS — I70211 Atherosclerosis of native arteries of extremities with intermittent claudication, right leg: Secondary | ICD-10-CM | POA: Diagnosis not present

## 2020-03-16 DIAGNOSIS — Z8739 Personal history of other diseases of the musculoskeletal system and connective tissue: Secondary | ICD-10-CM | POA: Diagnosis not present

## 2020-03-23 DIAGNOSIS — Z23 Encounter for immunization: Secondary | ICD-10-CM | POA: Diagnosis not present

## 2020-03-23 DIAGNOSIS — Z299 Encounter for prophylactic measures, unspecified: Secondary | ICD-10-CM | POA: Diagnosis not present

## 2020-03-23 DIAGNOSIS — H81313 Aural vertigo, bilateral: Secondary | ICD-10-CM | POA: Diagnosis not present

## 2020-03-23 DIAGNOSIS — Z789 Other specified health status: Secondary | ICD-10-CM | POA: Diagnosis not present

## 2020-03-23 DIAGNOSIS — G629 Polyneuropathy, unspecified: Secondary | ICD-10-CM | POA: Diagnosis not present

## 2020-04-06 DIAGNOSIS — Z85828 Personal history of other malignant neoplasm of skin: Secondary | ICD-10-CM | POA: Diagnosis not present

## 2020-04-06 DIAGNOSIS — L820 Inflamed seborrheic keratosis: Secondary | ICD-10-CM | POA: Diagnosis not present

## 2020-04-06 DIAGNOSIS — L821 Other seborrheic keratosis: Secondary | ICD-10-CM | POA: Diagnosis not present

## 2020-04-06 DIAGNOSIS — L905 Scar conditions and fibrosis of skin: Secondary | ICD-10-CM | POA: Diagnosis not present

## 2020-04-07 DIAGNOSIS — H81313 Aural vertigo, bilateral: Secondary | ICD-10-CM | POA: Diagnosis not present

## 2020-04-07 DIAGNOSIS — Z299 Encounter for prophylactic measures, unspecified: Secondary | ICD-10-CM | POA: Diagnosis not present

## 2020-04-07 DIAGNOSIS — H6591 Unspecified nonsuppurative otitis media, right ear: Secondary | ICD-10-CM | POA: Diagnosis not present

## 2020-04-07 DIAGNOSIS — Z789 Other specified health status: Secondary | ICD-10-CM | POA: Diagnosis not present

## 2020-04-07 DIAGNOSIS — G629 Polyneuropathy, unspecified: Secondary | ICD-10-CM | POA: Diagnosis not present

## 2020-04-13 DEATH — deceased

## 2020-04-14 DIAGNOSIS — N281 Cyst of kidney, acquired: Secondary | ICD-10-CM | POA: Diagnosis not present

## 2020-04-17 DIAGNOSIS — H02889 Meibomian gland dysfunction of unspecified eye, unspecified eyelid: Secondary | ICD-10-CM | POA: Diagnosis not present

## 2020-04-17 DIAGNOSIS — H35373 Puckering of macula, bilateral: Secondary | ICD-10-CM | POA: Diagnosis not present

## 2020-04-28 DIAGNOSIS — Z23 Encounter for immunization: Secondary | ICD-10-CM | POA: Diagnosis not present

## 2020-05-14 DIAGNOSIS — N281 Cyst of kidney, acquired: Secondary | ICD-10-CM | POA: Diagnosis not present

## 2020-06-22 DIAGNOSIS — K5190 Ulcerative colitis, unspecified, without complications: Secondary | ICD-10-CM | POA: Diagnosis not present

## 2020-06-22 DIAGNOSIS — Z1339 Encounter for screening examination for other mental health and behavioral disorders: Secondary | ICD-10-CM | POA: Diagnosis not present

## 2020-06-22 DIAGNOSIS — Z6829 Body mass index (BMI) 29.0-29.9, adult: Secondary | ICD-10-CM | POA: Diagnosis not present

## 2020-06-22 DIAGNOSIS — E7800 Pure hypercholesterolemia, unspecified: Secondary | ICD-10-CM | POA: Diagnosis not present

## 2020-06-22 DIAGNOSIS — Z1331 Encounter for screening for depression: Secondary | ICD-10-CM | POA: Diagnosis not present

## 2020-06-22 DIAGNOSIS — Z7189 Other specified counseling: Secondary | ICD-10-CM | POA: Diagnosis not present

## 2020-06-22 DIAGNOSIS — Z0000 Encounter for general adult medical examination without abnormal findings: Secondary | ICD-10-CM | POA: Diagnosis not present

## 2020-06-22 DIAGNOSIS — R5383 Other fatigue: Secondary | ICD-10-CM | POA: Diagnosis not present

## 2020-06-22 DIAGNOSIS — Z299 Encounter for prophylactic measures, unspecified: Secondary | ICD-10-CM | POA: Diagnosis not present

## 2020-07-06 ENCOUNTER — Encounter: Payer: Self-pay | Admitting: Internal Medicine

## 2020-08-05 ENCOUNTER — Encounter: Payer: Self-pay | Admitting: Internal Medicine

## 2020-08-05 ENCOUNTER — Encounter: Payer: Self-pay | Admitting: *Deleted

## 2020-08-05 ENCOUNTER — Other Ambulatory Visit: Payer: Self-pay

## 2020-08-05 ENCOUNTER — Ambulatory Visit (INDEPENDENT_AMBULATORY_CARE_PROVIDER_SITE_OTHER): Payer: Medicare Other | Admitting: Internal Medicine

## 2020-08-05 ENCOUNTER — Other Ambulatory Visit: Payer: Self-pay | Admitting: Internal Medicine

## 2020-08-05 VITALS — BP 140/71 | HR 74 | Temp 96.9°F | Ht 62.0 in | Wt 160.2 lb

## 2020-08-05 DIAGNOSIS — K5900 Constipation, unspecified: Secondary | ICD-10-CM | POA: Diagnosis not present

## 2020-08-05 DIAGNOSIS — K625 Hemorrhage of anus and rectum: Secondary | ICD-10-CM | POA: Diagnosis not present

## 2020-08-05 DIAGNOSIS — K51919 Ulcerative colitis, unspecified with unspecified complications: Secondary | ICD-10-CM | POA: Diagnosis not present

## 2020-08-05 MED ORDER — CLENPIQ 10-3.5-12 MG-GM -GM/160ML PO SOLN: 1.0000 | Freq: Once | ORAL | 0 refills | Status: AC

## 2020-08-05 NOTE — Patient Instructions (Signed)
We will schedule you for colonoscopy to evaluate your ulcerative colitis as well as for colon cancer screening purposes given your family history.  Further recommendations to follow.  At [facility] we value your feedback. You may receive a survey about your visit today. Please share your experience as we strive to create trusting relationships with our patients to provide genuine, compassionate, quality care.  We appreciate your understanding and patience as we review any laboratory studies, imaging, and other diagnostic tests that are ordered as we care for you. Our office policy is 5 business days for review of these results, and any emergent or urgent results are addressed in a timely manner for your best interest. If you do not hear from our office in 1 week, please contact us.   We also encourage the use of MyChart, which contains your medical information for your review as well. If you are not enrolled in this feature, an access code is on this after visit summary for your convenience. Thank you for allowing [place] to be involved in your care.  It was great to see you today!  I hope you have a great rest of your winter!!    Karen Simpson. Abbey Chatters, D.O. Gastroenterology and Hepatology [facility]

## 2020-08-05 NOTE — Telephone Encounter (Signed)
Prep not covered by insurance 

## 2020-08-11 NOTE — Progress Notes (Signed)
Primary Care Physician:  Monico Blitz, MD Primary Gastroenterologist:  Dr. Abbey Chatters  Chief Complaint  Patient presents with  . Ulcerative Colitis    Occasional constipation with bleeding    HPI:   Karen Simpson is a 70 y.o. female who presents to clinic today by referral from her PCP Dr. Manuella Ghazi for evaluation for UC.  Patient states she has a history of ulcerative colitis, not currently on any medications.  Does note some rectal bleeding which she believes is due to hemorrhoids.  Also has chronic constipation.  No abdominal pain.  No mucus in stools.  Does not currently taking medications for chronic UC.  No NSAID use.  Denies any upper GI symptoms including reflux, heartburn, dysphagia, odynophagia, chest pain  Reports her sister had colon cancer in the past.  Past Medical History:  Diagnosis Date  . SCC (squamous cell carcinoma), leg   . Ulcerative colitis [facility])     Past Surgical History:  Procedure Laterality Date  . CHOLECYSTECTOMY    . TONSILLECTOMY      Current Outpatient Medications  Medication Sig Dispense Refill  . aspirin EC 81 MG tablet Take 81 mg by mouth daily. Swallow whole.    . Calcium-Magnesium-Vitamin D (CALCIUM 1200+D3 PO) Take by mouth daily. Takes 2 tablets daily.    . Cholecalciferol (VITAMIN D3) 50 MCG (2000 UT) TABS Take by mouth daily.    . Cranberry-Cholecalciferol 4200-500 MG-UNIT CAPS Take by mouth. Takes 2 tablets daily.    . fexofenadine (ALLEGRA) 180 MG tablet Take 180 mg by mouth daily.    . Multiple Vitamins-Minerals (CENTRUM SILVER 50+WOMEN PO) Take by mouth daily.    Marland Kitchen UNABLE TO FIND daily. Med Name: Vit C 60mg  and Zinc 15 mg    . vitamin E 180 MG (400 UNITS) capsule Take 400 Units by mouth daily.     No current facility-administered medications for this visit.    Allergies as of 08/05/2020  . (No Known Allergies)    Family History  Problem Relation Age of Onset  . Colon cancer Sister   . Stroke Brother     Social History    Socioeconomic History  . Marital status: Married    Spouse name: Not on file  . Number of children: Not on file  . Years of education: Not on file  . Highest education level: Not on file  Occupational History  . Not on file  Tobacco Use  . Smoking status: Never Smoker  . Smokeless tobacco: Never Used  Substance and Sexual Activity  . Alcohol use: Not Currently  . Drug use: Never  . Sexual activity: Not on file  Other Topics Concern  . Not on file  Social History Narrative  . Not on file   Social Determinants of Health   Financial Resource Strain: Not on file  Food Insecurity: Not on file  Transportation Needs: Not on file  Physical Activity: Not on file  Stress: Not on file  Social Connections: Not on file  Intimate Partner Violence: Not on file    Subjective: Review of Systems  Constitutional: Negative for chills and fever.  HENT: Negative for congestion and hearing loss.   Eyes: Negative for blurred vision and double vision.  Respiratory: Negative for cough and shortness of breath.   Cardiovascular: Negative for chest pain and palpitations.  Gastrointestinal: Negative for abdominal pain, blood in stool, constipation, diarrhea, heartburn, melena and vomiting.  Genitourinary: Negative for dysuria and urgency.  Musculoskeletal: Negative  for joint pain and myalgias.  Skin: Negative for itching and rash.  Neurological: Negative for dizziness and headaches.  Psychiatric/Behavioral: Negative for depression. The patient is not nervous/anxious.        Objective: BP 140/71   Pulse 74   Temp (!) 96.9 F (36.1 C) (Temporal)   Ht 5\' 2"  (1.575 m)   Wt 160 lb 3.2 oz (72.7 kg)   BMI 29.30 kg/m  Physical Exam Constitutional:      Appearance: Normal appearance.  HENT:     Head: Normocephalic and atraumatic.  Eyes:     Extraocular Movements: Extraocular movements intact.     Conjunctiva/sclera: Conjunctivae normal.  Cardiovascular:     Rate and Rhythm: Normal rate  and regular rhythm.  Pulmonary:     Effort: Pulmonary effort is normal.     Breath sounds: Normal breath sounds.  Abdominal:     General: Bowel sounds are normal.     Palpations: Abdomen is soft.  Musculoskeletal:        General: No swelling. Normal range of motion.     Cervical back: Normal range of motion and neck supple.  Skin:    General: Skin is warm and dry.     Coloration: Skin is not jaundiced.  Neurological:     General: No focal deficit present.     Mental Status: She is alert and oriented to person, place, and time.  Psychiatric:        Mood and Affect: Mood normal.        Behavior: Behavior normal.      Assessment: *History of ulcerative colitis *Chronic constipation *Rectal bleeding *Family history of colon cancer  Plan: Will schedule for colonoscopy to evaluate disease activity of ulcerative colitis as well as rectal bleeding.The risks including infection, bleed, or perforation as well as benefits, limitations, alternatives and imponderables have been reviewed with the patient. Questions have been answered. All parties agreeable.  Thank you Dr. Manuella Ghazi for the kind referral.  Further recommendations to follow.   08/11/2020 1:30 PM   Disclaimer: This note was dictated with voice recognition software. Similar sounding words can inadvertently be transcribed and may not be corrected upon review.

## 2020-09-03 ENCOUNTER — Encounter (HOSPITAL_COMMUNITY): Payer: Self-pay

## 2020-09-04 ENCOUNTER — Other Ambulatory Visit (HOSPITAL_COMMUNITY)
Admission: RE | Admit: 2020-09-04 | Discharge: 2020-09-04 | Disposition: A | Discharge status: Home / Self Care | Payer: Medicare Other | Source: Ambulatory Visit | Attending: Internal Medicine | Admitting: Internal Medicine

## 2020-09-04 ENCOUNTER — Other Ambulatory Visit: Payer: Self-pay

## 2020-09-04 DIAGNOSIS — Z01812 Encounter for preprocedural laboratory examination: Secondary | ICD-10-CM | POA: Insufficient documentation

## 2020-09-04 DIAGNOSIS — Z20822 Contact with and (suspected) exposure to covid-19: Secondary | ICD-10-CM | POA: Insufficient documentation

## 2020-09-04 LAB — SARS CORONAVIRUS 2 (TAT 6-24 HRS): SARS Coronavirus 2: NEGATIVE

## 2020-09-07 ENCOUNTER — Encounter (HOSPITAL_COMMUNITY)
Admission: RE | Disposition: A | Discharge status: Home / Self Care | Payer: Self-pay | Source: Home / Self Care | Attending: Internal Medicine

## 2020-09-07 ENCOUNTER — Ambulatory Visit (HOSPITAL_COMMUNITY)
Admission: RE | Admit: 2020-09-07 | Discharge: 2020-09-07 | Disposition: A | Discharge status: Home / Self Care | Payer: Medicare Other | Attending: Internal Medicine | Admitting: Internal Medicine

## 2020-09-07 ENCOUNTER — Ambulatory Visit (HOSPITAL_COMMUNITY): Payer: Medicare Other | Admitting: Anesthesiology

## 2020-09-07 ENCOUNTER — Encounter (HOSPITAL_COMMUNITY): Payer: Self-pay

## 2020-09-07 ENCOUNTER — Other Ambulatory Visit: Payer: Self-pay

## 2020-09-07 DIAGNOSIS — Z800 Family history of malignant neoplasm of digestive organs: Secondary | ICD-10-CM | POA: Diagnosis not present

## 2020-09-07 DIAGNOSIS — Z823 Family history of stroke: Secondary | ICD-10-CM | POA: Diagnosis not present

## 2020-09-07 DIAGNOSIS — Z7982 Long term (current) use of aspirin: Secondary | ICD-10-CM | POA: Insufficient documentation

## 2020-09-07 DIAGNOSIS — K648 Other hemorrhoids: Secondary | ICD-10-CM | POA: Diagnosis not present

## 2020-09-07 DIAGNOSIS — K529 Noninfective gastroenteritis and colitis, unspecified: Secondary | ICD-10-CM | POA: Diagnosis not present

## 2020-09-07 DIAGNOSIS — K5730 Diverticulosis of large intestine without perforation or abscess without bleeding: Secondary | ICD-10-CM | POA: Diagnosis not present

## 2020-09-07 DIAGNOSIS — K5150 Left sided colitis without complications: Secondary | ICD-10-CM | POA: Diagnosis not present

## 2020-09-07 DIAGNOSIS — Z79899 Other long term (current) drug therapy: Secondary | ICD-10-CM | POA: Diagnosis not present

## 2020-09-07 DIAGNOSIS — K5909 Other constipation: Secondary | ICD-10-CM | POA: Insufficient documentation

## 2020-09-07 HISTORY — PX: COLONOSCOPY WITH PROPOFOL: SHX5780

## 2020-09-07 HISTORY — PX: BIOPSY: SHX5522

## 2020-09-07 SURGERY — COLONOSCOPY WITH PROPOFOL
Anesthesia: General

## 2020-09-07 MED ORDER — LACTATED RINGERS IV SOLN: INTRAVENOUS | Status: DC

## 2020-09-07 MED ORDER — LIDOCAINE HCL (CARDIAC) PF 100 MG/5ML IV SOSY
PREFILLED_SYRINGE | INTRAVENOUS | Status: DC | PRN
  Administered 2020-09-07: 50 mg via INTRAVENOUS

## 2020-09-07 MED ORDER — PROPOFOL 10 MG/ML IV BOLUS
INTRAVENOUS | Status: DC | PRN
  Administered 2020-09-07: 60 mg via INTRAVENOUS
  Administered 2020-09-07: 30 mg via INTRAVENOUS
  Administered 2020-09-07: 100 mg via INTRAVENOUS

## 2020-09-07 NOTE — Anesthesia Postprocedure Evaluation (Signed)
Anesthesia Post Note  Patient: Karen Simpson  Procedure(s) Performed: COLONOSCOPY WITH PROPOFOL (N/A ) BIOPSY  Patient location during evaluation: Phase II Anesthesia Type: General Level of consciousness: awake and alert Pain management: satisfactory to patient Vital Signs Assessment: post-procedure vital signs reviewed and stable Respiratory status: spontaneous breathing and respiratory function stable Cardiovascular status: blood pressure returned to baseline Postop Assessment: no apparent nausea or vomiting Anesthetic complications: no   No complications documented.   Last Vitals:  Vitals:   09/07/20 1034  BP: 133/74  Pulse: 73  Resp: 10  Temp: 36.8 C  SpO2: 100%    Last Pain:  Vitals:   09/07/20 1145  TempSrc:   PainSc: 0-No pain                 [name]

## 2020-09-07 NOTE — Op Note (Signed)
[facility] Patient Name: Karen Simpson Procedure Date: 09/07/2020 11:37 AM MRN: [id number] Date of Birth: Jun 26, 1950 Attending MD: Elon Alas. Edgar Frisk CSN: [id number] Age: 70 Admit Type: Outpatient Procedure:                Colonoscopy Indications:              Disease activity assessment of left-sided chronic                            ulcerative colitis Providers:                Elon Alas. Ron Beske, DO, [place]is [place] B. Sharon Seller, RN,                            Nelma Rothman, Technician Referring MD:              Medicines:                See the Anesthesia note for documentation of the                            administered medications Complications:            No immediate complications. Estimated Blood Loss:     Estimated blood loss was minimal. Procedure:                Pre-Anesthesia Assessment:                           - The anesthesia plan was to use monitored                            anesthesia care (MAC).                           After obtaining informed consent, the colonoscope                            was passed under direct vision. Throughout the                            procedure, the patient's blood pressure, pulse, and                            oxygen saturations were monitored continuously. The                            [id number]-HQ190L ([id number]) scope was introduced through                            the anus and advanced to the the cecum, identified                            by appendiceal orifice and ileocecal valve. The                            colonoscopy was performed without difficulty. The  patient tolerated the procedure well. The quality                            of the bowel preparation was evaluated using the                            BBPS [facility] Bowel Preparation Scale) with scores                            of: Right Colon = 3, Transverse Colon = 3 and Left                            Colon = 3 (entire mucosa seen well with  no residual                            staining, small fragments of stool or opaque                            liquid). The total BBPS score equals 9. Scope In: 11:48:46 AM Scope Out: 11:59:42 AM Scope Withdrawal Time: 0 hours 7 minutes 24 seconds  Total Procedure Duration: 0 hours 10 minutes 56 seconds  Findings:      The perianal and digital rectal examinations were normal.      Non-bleeding internal hemorrhoids were found during endoscopy.      A few small-mouthed diverticula were found in the sigmoid colon and       descending colon.      Biopsies were taken with a cold forceps in the entire colon for       histology.      The exam was otherwise without abnormality. Impression:               - Non-bleeding internal hemorrhoids.                           - Diverticulosis in the sigmoid colon and in the                            descending colon.                           - The examination was otherwise normal.                           - Biopsies were taken with a cold forceps for                            histology in the entire colon. Moderate Sedation:      Per Anesthesia Care Recommendation:           - Patient has a contact number available for                            emergencies. The signs and symptoms of potential  delayed complications were discussed with the                            patient. Return to normal activities tomorrow.                            Written discharge instructions were provided to the                            patient.                           - Resume previous diet.                           - Continue present medications.                           - Await pathology results.                           - Repeat colonoscopy date to be determined after                            pending pathology results are reviewed for                            surveillance based on pathology results.                           -  Return to GI clinic in 3 months. Procedure Code(s):        --- Professional ---                           [phone], Colonoscopy, flexible; with biopsy, single                            or multiple Diagnosis Code(s):        --- Professional ---                           K64.8, Other hemorrhoids                           K51.50, Left sided colitis without complications                           K57.30, Diverticulosis of large intestine without                            perforation or abscess without bleeding CPT copyright 2019 American Medical Association. All rights reserved. The codes documented in this report are preliminary and upon coder review may  be revised to meet current compliance requirements. Elon Alas. Abbey Chatters, DO [place] Abbey Chatters, DO 09/07/2020 12:05:06 PM This report has been signed electronically. Number of Addenda: 0

## 2020-09-07 NOTE — Discharge Instructions (Addendum)
  Colonoscopy Discharge Instructions  Read the instructions outlined below and refer to this sheet in the next few weeks. These discharge instructions provide you with general information on caring for yourself after you leave the hospital. Your doctor may also give you specific instructions. While your treatment has been planned according to the most current medical practices available, unavoidable complications occasionally occur.   ACTIVITY  You may resume your regular activity, but move at a slower pace for the next 24 hours.   Take frequent rest periods for the next 24 hours.   Walking will help get rid of the air and reduce the bloated feeling in your belly (abdomen).   No driving for 24 hours (because of the medicine (anesthesia) used during the test).    Do not sign any important legal documents or operate any machinery for 24 hours (because of the anesthesia used during the test).  NUTRITION  Drink plenty of fluids.   You may resume your normal diet as instructed by your doctor.   Begin with a light meal and progress to your normal diet. Heavy or fried foods are harder to digest and may make you feel sick to your stomach (nauseated).   Avoid alcoholic beverages for 24 hours or as instructed.  MEDICATIONS  You may resume your normal medications unless your doctor tells you otherwise.  WHAT YOU CAN EXPECT TODAY  Some feelings of bloating in the abdomen.   Passage of more gas than usual.   Spotting of blood in your stool or on the toilet paper.  IF YOU HAD POLYPS REMOVED DURING THE COLONOSCOPY:  No aspirin products for 7 days or as instructed.   No alcohol for 7 days or as instructed.   Eat a soft diet for the next 24 hours.  FINDING OUT THE RESULTS OF YOUR TEST Not all test results are available during your visit. If your test results are not back during the visit, make an appointment with your caregiver to find out the results. Do not assume everything is normal if  you have not heard from your caregiver or the medical facility. It is important for you to follow up on all of your test results.  SEEK IMMEDIATE MEDICAL ATTENTION IF:  You have more than a spotting of blood in your stool.   Your belly is swollen (abdominal distention).   You are nauseated or vomiting.   You have a temperature over 101.   You have abdominal pain or discomfort that is severe or gets worse throughout the day.   Your Colonoscopy was relatively unremarkable.  I did not find any active inflammation but part of the colon did look a little "burnt out" which can be a sign of chronic inflammation.  I took biopsies throughout the colon.  Await pathology results, my office will contact you.  I did not find a polyps or evidence of colon cancer.  Follow-up with GI in 3 months or sooner if needed.  I hope you have a great rest of your week!  Elon Alas. Abbey Chatters, D.O. Gastroenterology and Hepatology [facility]

## 2020-09-07 NOTE — H&P (Signed)
Primary Care Physician:  Monico Blitz, MD Primary Gastroenterologist:  Dr. Abbey Chatters  Pre-Procedure History & Physical: HPI:  Karen Simpson is a 70 y.o. female is here for a colonoscopy for history of ulcerative colitis. Patient states she has a history of ulcerative colitis, not currently on any medications.  Does note some rectal bleeding which she believes is due to hemorrhoids.  Also has chronic constipation.  No abdominal pain.  No mucus in stools.  Does not currently taking medications for chronic UC.  No NSAID use.  Denies any upper GI symptoms including reflux, heartburn, dysphagia, odynophagia, chest pain  Reports her sister had colon cancer in the past.  Past Medical History:  Diagnosis Date  . SCC (squamous cell carcinoma), leg   . Ulcerative colitis [facility])     Past Surgical History:  Procedure Laterality Date  . CHOLECYSTECTOMY    . EYE SURGERY Bilateral    cataract  . TONSILLECTOMY      Prior to Admission medications   Medication Sig Start Date End Date Taking? Authorizing Provider  aspirin EC 81 MG tablet Take 81 mg by mouth daily. Swallow whole.   Yes [provider]  Calcium-Magnesium-Vitamin D (CALCIUM 1200+D3 PO) Take 2 tablets by mouth daily. Takes 2 tablets daily.   Yes [provider]  Cholecalciferol (VITAMIN D3) 50 MCG (2000 UT) TABS Take 2,000 Units by mouth daily.   Yes [provider]  CRANBERRY-VITAMIN C PO Take 2 tablets by mouth daily. 4200 mg per tab   Yes [provider]  fexofenadine (ALLEGRA) 180 MG tablet Take 180 mg by mouth daily.   Yes [provider]  Multiple Vitamins-Minerals (CENTRUM SILVER 50+WOMEN PO) Take 1 tablet by mouth daily.   Yes [provider]  UNABLE TO FIND Take 1 tablet by mouth daily. Med Name: Vit C 60mg  and Zinc 15 mg   Yes [provider]  vitamin E 180 MG (400 UNITS) capsule Take 400 Units by mouth daily.   Yes [provider]    Allergies as of  08/05/2020  . (No Known Allergies)    Family History  Problem Relation Age of Onset  . Colon cancer Sister   . Stroke Brother     Social History   Socioeconomic History  . Marital status: Single    Spouse name: Not on file  . Number of children: Not on file  . Years of education: Not on file  . Highest education level: Not on file  Occupational History  . Not on file  Tobacco Use  . Smoking status: Never Smoker  . Smokeless tobacco: Never Used  Substance and Sexual Activity  . Alcohol use: Not Currently  . Drug use: Never  . Sexual activity: Not on file  Other Topics Concern  . Not on file  Social History Narrative  . Not on file   Social Determinants of Health   Financial Resource Strain: Not on file  Food Insecurity: Not on file  Transportation Needs: Not on file  Physical Activity: Not on file  Stress: Not on file  Social Connections: Not on file  Intimate Partner Violence: Not on file    Review of Systems: See HPI, otherwise negative ROS  Physical Exam: Vital signs in last 24 hours: Temp:  [98.3 F (36.8 C)] 98.3 F (36.8 C) (03/28 1034) Pulse Rate:  [73] 73 (03/28 1034) Resp:  [10] 10 (03/28 1034) BP: (133)/(74) 133/74 (03/28 1034) SpO2:  [100 %] 100 % (03/28  1034) Weight:  [70.3 kg] 70.3 kg (03/28 1034)   General:   Alert,  Well-developed, well-nourished, pleasant and cooperative in NAD Head:  Normocephalic and atraumatic. Eyes:  Sclera clear, no icterus.   Conjunctiva pink. Ears:  Normal auditory acuity. Nose:  No deformity, discharge,  or lesions. Mouth:  No deformity or lesions, dentition normal. Neck:  Supple; no masses or thyromegaly. Lungs:  Clear throughout to auscultation.   No wheezes, crackles, or rhonchi. No acute distress. Heart:  Regular rate and rhythm; no murmurs, clicks, rubs,  or gallops. Abdomen:  Soft, nontender and nondistended. No masses, hepatosplenomegaly or hernias noted. Normal bowel sounds, without guarding, and without  rebound.   Msk:  Symmetrical without gross deformities. Normal posture. Extremities:  Without clubbing or edema. Neurologic:  Alert and  oriented x4;  grossly normal neurologically. Skin:  Intact without significant lesions or rashes. Cervical Nodes:  No significant cervical adenopathy. Psych:  Alert and cooperative. Normal mood and affect.  Impression/Plan: Karen Simpson is here  for a colonoscopy for history of ulcerative colitis. The risks of the procedure including infection, bleed, or perforation as well as benefits, limitations, alternatives and imponderables have been reviewed with the patient. Questions have been answered. All parties agreeable.

## 2020-09-07 NOTE — Transfer of Care (Signed)
Immediate Anesthesia Transfer of Care Note  Patient: Karen Simpson  Procedure(s) Performed: COLONOSCOPY WITH PROPOFOL (N/A ) BIOPSY  Patient Location: PACU  Anesthesia Type:General  Level of Consciousness: awake and alert   Airway & Oxygen Therapy: Patient Spontanous Breathing  Post-op Assessment: Report given to RN and Post -op Vital signs reviewed and stable  Post vital signs: Reviewed and stable  Last Vitals:  Vitals Value Taken Time  BP    Temp    Pulse    Resp    SpO2      Last Pain:  Vitals:   09/07/20 1145  TempSrc:   PainSc: 0-No pain      Patients Stated Pain Goal: 6 (51/10/21 1173)  Complications: No complications documented.

## 2020-09-07 NOTE — Anesthesia Preprocedure Evaluation (Addendum)
Anesthesia Evaluation  Patient identified by MRN, date of birth, ID band Patient awake    Reviewed: Allergy & Precautions, NPO status , Patient's Chart, lab work & pertinent test results  History of Anesthesia Complications Negative for: history of anesthetic complications  Airway Mallampati: III  TM Distance: >3 FB Neck ROM: Full    Dental  (+) Dental Advisory Given, Loose,    Pulmonary neg pulmonary ROS,    Pulmonary exam normal breath sounds clear to auscultation       Cardiovascular Exercise Tolerance: Good Normal cardiovascular exam Rhythm:Regular Rate:Normal     Neuro/Psych negative neurological ROS  negative psych ROS   GI/Hepatic Neg liver ROS, PUD, Bowel prep (ulceraitve colitis ),Ulcerative colitis   Endo/Other  negative endocrine ROS  Renal/GU negative Renal ROS     Musculoskeletal negative musculoskeletal ROS (+)   Abdominal   Peds  Hematology negative hematology ROS (+)   Anesthesia Other Findings   Reproductive/Obstetrics negative OB ROS                            Anesthesia Physical Anesthesia Plan  ASA: II  Anesthesia Plan: General   Post-op Pain Management:    Induction:   PONV Risk Score and Plan: Propofol infusion  Airway Management Planned: Nasal Cannula and Natural Airway  Additional Equipment:   Intra-op Plan:   Post-operative Plan:   Informed Consent: I have reviewed the patients History and Physical, chart, labs and discussed the procedure including the risks, benefits and alternatives for the proposed anesthesia with the patient or authorized representative who has indicated his/her understanding and acceptance.     Dental advisory given  Plan Discussed with: CRNA and Surgeon  Anesthesia Plan Comments:         Anesthesia Quick Evaluation

## 2020-09-08 LAB — SURGICAL PATHOLOGY

## 2020-09-09 ENCOUNTER — Other Ambulatory Visit: Payer: Self-pay | Admitting: Internal Medicine

## 2020-09-09 DIAGNOSIS — Z1231 Encounter for screening mammogram for malignant neoplasm of breast: Secondary | ICD-10-CM

## 2020-09-14 ENCOUNTER — Other Ambulatory Visit: Payer: Self-pay

## 2020-09-14 ENCOUNTER — Ambulatory Visit
Admission: RE | Admit: 2020-09-14 | Discharge: 2020-09-14 | Disposition: A | Discharge status: Home / Self Care | Payer: Medicare Other | Source: Ambulatory Visit | Attending: Internal Medicine | Admitting: Internal Medicine

## 2020-09-14 DIAGNOSIS — Z1231 Encounter for screening mammogram for malignant neoplasm of breast: Secondary | ICD-10-CM | POA: Diagnosis not present

## 2020-10-06 DIAGNOSIS — C44329 Squamous cell carcinoma of skin of other parts of face: Secondary | ICD-10-CM | POA: Diagnosis not present

## 2020-10-06 DIAGNOSIS — L570 Actinic keratosis: Secondary | ICD-10-CM | POA: Diagnosis not present

## 2020-10-06 DIAGNOSIS — Z85828 Personal history of other malignant neoplasm of skin: Secondary | ICD-10-CM | POA: Diagnosis not present

## 2020-10-06 DIAGNOSIS — L905 Scar conditions and fibrosis of skin: Secondary | ICD-10-CM | POA: Diagnosis not present

## 2020-10-06 DIAGNOSIS — D485 Neoplasm of uncertain behavior of skin: Secondary | ICD-10-CM | POA: Diagnosis not present

## 2020-10-14 DIAGNOSIS — D485 Neoplasm of uncertain behavior of skin: Secondary | ICD-10-CM | POA: Diagnosis not present

## 2020-10-14 DIAGNOSIS — D0439 Carcinoma in situ of skin of other parts of face: Secondary | ICD-10-CM | POA: Diagnosis not present

## 2020-10-14 DIAGNOSIS — C44329 Squamous cell carcinoma of skin of other parts of face: Secondary | ICD-10-CM | POA: Diagnosis not present

## 2020-11-11 DIAGNOSIS — L249 Irritant contact dermatitis, unspecified cause: Secondary | ICD-10-CM | POA: Diagnosis not present

## 2020-11-11 DIAGNOSIS — D0439 Carcinoma in situ of skin of other parts of face: Secondary | ICD-10-CM | POA: Diagnosis not present

## 2020-11-15 DIAGNOSIS — Z20822 Contact with and (suspected) exposure to covid-19: Secondary | ICD-10-CM | POA: Diagnosis not present

## 2020-11-15 DIAGNOSIS — J069 Acute upper respiratory infection, unspecified: Secondary | ICD-10-CM | POA: Diagnosis not present

## 2020-12-09 ENCOUNTER — Other Ambulatory Visit: Payer: Self-pay

## 2020-12-09 ENCOUNTER — Telehealth: Payer: Self-pay | Admitting: *Deleted

## 2020-12-09 ENCOUNTER — Ambulatory Visit (INDEPENDENT_AMBULATORY_CARE_PROVIDER_SITE_OTHER): Payer: Medicare Other | Admitting: Gastroenterology

## 2020-12-09 ENCOUNTER — Telehealth: Payer: Self-pay | Admitting: Gastroenterology

## 2020-12-09 ENCOUNTER — Encounter: Payer: Self-pay | Admitting: Gastroenterology

## 2020-12-09 DIAGNOSIS — K5190 Ulcerative colitis, unspecified, without complications: Secondary | ICD-10-CM | POA: Diagnosis not present

## 2020-12-09 DIAGNOSIS — K625 Hemorrhage of anus and rectum: Secondary | ICD-10-CM | POA: Diagnosis not present

## 2020-12-09 MED ORDER — HYDROCORTISONE (PERIANAL) 2.5 % EX CREA: 1.0000 "application " | TOPICAL_CREAM | Freq: Two times a day (BID) | CUTANEOUS | 1 refills | Status: AC

## 2020-12-09 NOTE — Telephone Encounter (Signed)
Karen Simpson [place], you are scheduled for a virtual visit with your provider today.  Just as we do with appointments in the office, we must obtain your consent to participate.  Your consent will be active for this visit and any virtual visit you may have with one of our providers in the next 365 days.  If you have a MyChart account, I can also send a copy of this consent to you electronically.  All virtual visits are billed to your insurance company just like a traditional visit in the office.  As this is a virtual visit, video technology does not allow for your provider to perform a traditional examination.  This may limit your provider's ability to fully assess your condition.  If your provider identifies any concerns that need to be evaluated in person or the need to arrange testing such as labs, EKG, etc, we will make arrangements to do so.  Although advances in technology are sophisticated, we cannot ensure that it will always work on either your end or our end.  If the connection with a video visit is poor, we may have to switch to a telephone visit.  With either a video or telephone visit, we are not always able to ensure that we have a secure connection.   I need to obtain your verbal consent now.   Are you willing to proceed with your visit today?

## 2020-12-09 NOTE — Telephone Encounter (Signed)
Requested records, reminder in recall

## 2020-12-09 NOTE — Patient Instructions (Signed)
Trial of Anusol cream apply anorectally twice daily for 14 days. You may repeat if needed but take breaks off medication to avoid long-term use.  We will request copy of your records from Dr. Earley Brooke. Call in two weeks and let us know if Anusol cream is helping with rectal bleeding/irritation. Return to see Dr. Abbey Chatters in 3 months.

## 2020-12-09 NOTE — Telephone Encounter (Signed)
Please mail copy of AVS.  Please request copy of last colonoscopy, upper endoscopy, pathology and last 3 OV notes from Dr. Earley Brooke. Make return OV to see Dr. Abbey Chatters in 3 months.

## 2020-12-09 NOTE — Telephone Encounter (Signed)
Mailed AVS.  Routing to Zeb Comfort and Caryl Asp to get records and schedule follow up.

## 2020-12-09 NOTE — Telephone Encounter (Addendum)
Pt consented to telephone visit. 

## 2020-12-09 NOTE — Progress Notes (Signed)
Primary Care Physician:  Monico Blitz, MD Primary GI:  Elon Alas. Abbey Chatters, DO   Patient Location: Home  Provider Location: Home  Reason for Phone Visit:  Chief Complaint  Patient presents with   Rectal Bleeding    Bleeding off and on     Persons present on the phone encounter, with roles: Patient, myself (provider),martina booth, LPN (updated meds and allergies)  Total time (minutes) spent on medical discussion: 12 minutes  Due to COVID-19, visit was conducted using telephonic method (no video was available).  Visit was requested by patient.  Virtual Visit via Telephone only  I connected with Ms. Karen Simpson on 12/09/20 at  9:00 AM EDT by telephone and verified that I am speaking with the correct person using two identifiers.   I discussed the limitations, risks, security and privacy concerns of performing an evaluation and management service by telephone and the availability of in person appointments. I also discussed with the patient that there may be a patient responsible charge related to this service. The patient expressed understanding and agreed to proceed.   HPI:   Patient is a pleasant 70 y/o female who presents for telephone visit regarding rectal bleeding, H/O UC, previously followed by Dr. Earley Brooke. Recently established care here locally. Completed colonoscopy 08/2020 for rectal bleeding and UC follow up. She also has FH CRC.   Colonoscopy 08/2020: - Non-bleeding internal hemorrhoids. - Diverticulosis in the sigmoid colon and in the descending colon. - The examination was otherwise normal. - Biopsies were taken with a cold forceps for histology in the entire colon. - Path with focal active colitits  Intermittent anal irritation and rectal bleeding off/on. BM 1-2 times per day. Stools soft, not hard. Sometimes hard to get stools started, feels little "scratchy". Will see a little blood with wiping. Has never tried hemorrhoid creams. No abdominal pain. No n/v. No  heartburn. States diagnosed with UC around 2017/2018. Back then mostly diarrhea. Was put on sulfasalazine, but eventually she stopped going to see Dr. Earley Brooke as he "seemed to be indecisive". States within five minutes during a visit, he told her to take medicine a two different doses and ultimately told her she didn't have to take at all. States she was also treated for a "bacteria in my gut".    Current Outpatient Medications  Medication Sig Dispense Refill   aspirin EC 81 MG tablet Take 81 mg by mouth daily. Swallow whole.     Calcium-Magnesium-Vitamin D (CALCIUM 1200+D3 PO) Take 2 tablets by mouth daily. Takes 2 tablets daily.     cetirizine (ZYRTEC) 10 MG tablet Take 10 mg by mouth daily.     Cholecalciferol (VITAMIN D3) 50 MCG (2000 UT) TABS Take 2,000 Units by mouth daily.     CRANBERRY-VITAMIN C PO Take 2 tablets by mouth daily. 4200 mg per tab     gabapentin (NEURONTIN) 300 MG capsule Take 300 mg by mouth daily.     Multiple Vitamins-Minerals (CENTRUM SILVER 50+WOMEN PO) Take 1 tablet by mouth daily.     UNABLE TO FIND Take 1 tablet by mouth daily. Med Name: Vit C 60mg  and Zinc 15 mg     vitamin E 180 MG (400 UNITS) capsule Take 400 Units by mouth daily.     No current facility-administered medications for this visit.    Past Medical History:  Diagnosis Date   SCC (squamous cell carcinoma), leg    Ulcerative colitis ([place])     Past Surgical History:  Procedure Laterality Date   BIOPSY  09/07/2020   Procedure: BIOPSY;  Surgeon: Eloise Harman, DO;  [facility];  Service: Endoscopy;;   CHOLECYSTECTOMY     COLONOSCOPY WITH PROPOFOL N/A 09/07/2020   Procedure: COLONOSCOPY WITH PROPOFOL;  Surgeon: Eloise Harman, DO;  [facility];  Service: Endoscopy;  Laterality: N/A;  am appt   EYE SURGERY Bilateral    cataract   TONSILLECTOMY      Family History  Problem Relation Age of Onset   Colon cancer Sister    Stroke Brother     Social History    Socioeconomic History   Marital status: Single    Spouse name: Not on file   Number of children: Not on file   Years of education: Not on file   Highest education level: Not on file  Occupational History   Not on file  Tobacco Use   Smoking status: Never   Smokeless tobacco: Never  Substance and Sexual Activity   Alcohol use: Not Currently   Drug use: Never   Sexual activity: Not on file  Other Topics Concern   Not on file  Social History Narrative   Not on file   Social Determinants of Health   Financial Resource Strain: Not on file  Food Insecurity: Not on file  Transportation Needs: Not on file  Physical Activity: Not on file  Stress: Not on file  Social Connections: Not on file  Intimate Partner Violence: Not on file      ROS:  General: Negative for anorexia, weight loss, fever, chills, fatigue, weakness. Eyes: Negative for vision changes.  ENT: Negative for hoarseness, difficulty swallowing , nasal congestion. CV: Negative for chest pain, angina, palpitations, dyspnea on exertion, peripheral edema.  Respiratory: Negative for dyspnea at rest, dyspnea on exertion, cough, sputum, wheezing.  GI: See history of present illness. GU:  Negative for dysuria, hematuria, urinary incontinence, urinary frequency, nocturnal urination.  MS: Negative for joint pain, low back pain.  Derm: Negative for rash or itching.  Neuro: Negative for weakness, abnormal sensation, seizure, frequent headaches, memory loss, confusion.  Psych: Negative for anxiety, depression, suicidal ideation, hallucinations.  Endo: Negative for unusual weight change.  Heme: Negative for bruising or bleeding. Allergy: Negative for rash or hives.   Observations/Objective: Pleasant, cooperative, NAD. No SOB.  Assessment and Plan:  Rectal bleeding in setting of h/o UC. Has been off UC medications for years. Recent colonoscopy with no endoscopic evidence of active disease, but on random colon biopsies she  has some focal nonspecific active colitis findings. She had internal hemorrhoids as well. I suspect her bleeding is from hemorrhoids. She has not been treated for hemorrhoids and willing to try topical regimen.   She givens history of being treating for "bacteria in my gut". ?H.pylori?Marland Kitchen   We will request copy of records from Dr. Earley Brooke.   Progress report in two weeks from patient regarding rectal bleeding, after starting Anusol.   Return to see Dr. Abbey Chatters in three months.   Follow Up Instructions:    I discussed the assessment and treatment plan with the patient. The patient was provided an opportunity to ask questions and all were answered. The patient agreed with the plan and demonstrated an understanding of the instructions. AVS mailed to patient's home address.   The patient was advised to call back or seek an in-person evaluation if the symptoms worsen or if the condition fails to improve as anticipated.  I provided 12  minutes of non-face-to-face time during this encounter.   Neil Crouch, PA-C

## 2020-12-20 ENCOUNTER — Telehealth: Payer: Self-pay | Admitting: Gastroenterology

## 2020-12-20 NOTE — Telephone Encounter (Signed)
Received records from [facility]  Colonoscopy 06/2016: hemorrhoids, inflammation (erythema, edema, micro-ulcerations, more in the proximal 2/3 of the colon. Path "nonspecific chronic inflammation of cecum and rectum".   Per OV note: patient treated with welchol, questran. Symptoms improved on mesalamine. "Advised to stay on Azulfidine 2gm/day with folic acid".    PATIENT WITH VIRTUAL VISIT RECENTLY. SHE HAD COLONOSCOPY TO ASSESS DISEASE ACTIVITY 08/2020.   DR. Abbey Chatters, PLEASE COMMENT REGARDING PATHOLOGY FROM 08/2020 COLONOSCOPY. APPEARS THAT IT HAS NOT BEEN ADDRESSED?

## 2020-12-21 DIAGNOSIS — Z6829 Body mass index (BMI) 29.0-29.9, adult: Secondary | ICD-10-CM | POA: Diagnosis not present

## 2020-12-21 DIAGNOSIS — K5190 Ulcerative colitis, unspecified, without complications: Secondary | ICD-10-CM | POA: Diagnosis not present

## 2020-12-21 DIAGNOSIS — Z6830 Body mass index (BMI) 30.0-30.9, adult: Secondary | ICD-10-CM | POA: Diagnosis not present

## 2020-12-21 DIAGNOSIS — Z299 Encounter for prophylactic measures, unspecified: Secondary | ICD-10-CM | POA: Diagnosis not present

## 2020-12-21 DIAGNOSIS — G629 Polyneuropathy, unspecified: Secondary | ICD-10-CM | POA: Diagnosis not present

## 2020-12-21 DIAGNOSIS — K649 Unspecified hemorrhoids: Secondary | ICD-10-CM | POA: Diagnosis not present

## 2021-01-05 DIAGNOSIS — C44629 Squamous cell carcinoma of skin of left upper limb, including shoulder: Secondary | ICD-10-CM | POA: Diagnosis not present

## 2021-01-05 DIAGNOSIS — L905 Scar conditions and fibrosis of skin: Secondary | ICD-10-CM | POA: Diagnosis not present

## 2021-01-05 DIAGNOSIS — Z85828 Personal history of other malignant neoplasm of skin: Secondary | ICD-10-CM | POA: Diagnosis not present

## 2021-01-05 DIAGNOSIS — L570 Actinic keratosis: Secondary | ICD-10-CM | POA: Diagnosis not present

## 2021-01-05 DIAGNOSIS — D485 Neoplasm of uncertain behavior of skin: Secondary | ICD-10-CM | POA: Diagnosis not present

## 2021-01-21 ENCOUNTER — Encounter: Payer: Self-pay | Admitting: Internal Medicine

## 2021-01-22 DIAGNOSIS — C44629 Squamous cell carcinoma of skin of left upper limb, including shoulder: Secondary | ICD-10-CM | POA: Diagnosis not present

## 2021-02-10 ENCOUNTER — Encounter: Payer: Self-pay | Admitting: Internal Medicine

## 2021-03-08 DIAGNOSIS — K529 Noninfective gastroenteritis and colitis, unspecified: Secondary | ICD-10-CM | POA: Diagnosis not present

## 2021-03-08 DIAGNOSIS — Z6829 Body mass index (BMI) 29.0-29.9, adult: Secondary | ICD-10-CM | POA: Diagnosis not present

## 2021-03-08 DIAGNOSIS — Z299 Encounter for prophylactic measures, unspecified: Secondary | ICD-10-CM | POA: Diagnosis not present

## 2021-03-08 DIAGNOSIS — Z23 Encounter for immunization: Secondary | ICD-10-CM | POA: Diagnosis not present

## 2021-03-08 DIAGNOSIS — Z713 Dietary counseling and surveillance: Secondary | ICD-10-CM | POA: Diagnosis not present

## 2021-03-15 DIAGNOSIS — R509 Fever, unspecified: Secondary | ICD-10-CM | POA: Diagnosis not present

## 2021-03-15 DIAGNOSIS — Z299 Encounter for prophylactic measures, unspecified: Secondary | ICD-10-CM | POA: Diagnosis not present

## 2021-03-15 DIAGNOSIS — J329 Chronic sinusitis, unspecified: Secondary | ICD-10-CM | POA: Diagnosis not present

## 2021-03-15 DIAGNOSIS — R197 Diarrhea, unspecified: Secondary | ICD-10-CM | POA: Diagnosis not present

## 2021-03-16 DIAGNOSIS — R197 Diarrhea, unspecified: Secondary | ICD-10-CM | POA: Diagnosis not present

## 2021-03-24 ENCOUNTER — Ambulatory Visit: Payer: Medicare Other | Admitting: Internal Medicine

## 2021-04-05 DIAGNOSIS — Z6829 Body mass index (BMI) 29.0-29.9, adult: Secondary | ICD-10-CM | POA: Diagnosis not present

## 2021-04-05 DIAGNOSIS — Z299 Encounter for prophylactic measures, unspecified: Secondary | ICD-10-CM | POA: Diagnosis not present

## 2021-04-05 DIAGNOSIS — K5190 Ulcerative colitis, unspecified, without complications: Secondary | ICD-10-CM | POA: Diagnosis not present

## 2021-04-05 DIAGNOSIS — K649 Unspecified hemorrhoids: Secondary | ICD-10-CM | POA: Diagnosis not present

## 2021-04-05 DIAGNOSIS — K529 Noninfective gastroenteritis and colitis, unspecified: Secondary | ICD-10-CM | POA: Diagnosis not present

## 2021-04-07 ENCOUNTER — Ambulatory Visit: Payer: Medicare Other | Admitting: Internal Medicine

## 2021-04-07 DIAGNOSIS — L578 Other skin changes due to chronic exposure to nonionizing radiation: Secondary | ICD-10-CM | POA: Diagnosis not present

## 2021-04-07 DIAGNOSIS — R229 Localized swelling, mass and lump, unspecified: Secondary | ICD-10-CM | POA: Diagnosis not present

## 2021-04-07 DIAGNOSIS — Z85828 Personal history of other malignant neoplasm of skin: Secondary | ICD-10-CM | POA: Diagnosis not present

## 2021-04-07 DIAGNOSIS — C44629 Squamous cell carcinoma of skin of left upper limb, including shoulder: Secondary | ICD-10-CM | POA: Diagnosis not present

## 2021-04-07 DIAGNOSIS — Z08 Encounter for follow-up examination after completed treatment for malignant neoplasm: Secondary | ICD-10-CM | POA: Diagnosis not present

## 2021-04-07 DIAGNOSIS — L821 Other seborrheic keratosis: Secondary | ICD-10-CM | POA: Diagnosis not present

## 2021-04-07 DIAGNOSIS — L570 Actinic keratosis: Secondary | ICD-10-CM | POA: Diagnosis not present

## 2021-04-15 ENCOUNTER — Ambulatory Visit: Payer: Medicare Other | Admitting: Internal Medicine

## 2021-04-15 DIAGNOSIS — N281 Cyst of kidney, acquired: Secondary | ICD-10-CM | POA: Diagnosis not present

## 2021-04-20 DIAGNOSIS — R42 Dizziness and giddiness: Secondary | ICD-10-CM | POA: Diagnosis not present

## 2021-04-21 DIAGNOSIS — H524 Presbyopia: Secondary | ICD-10-CM | POA: Diagnosis not present

## 2021-04-21 DIAGNOSIS — H35373 Puckering of macula, bilateral: Secondary | ICD-10-CM | POA: Diagnosis not present

## 2021-05-12 ENCOUNTER — Ambulatory Visit (INDEPENDENT_AMBULATORY_CARE_PROVIDER_SITE_OTHER): Payer: Medicare Other | Admitting: Internal Medicine

## 2021-05-12 ENCOUNTER — Other Ambulatory Visit: Payer: Self-pay

## 2021-05-12 DIAGNOSIS — K5190 Ulcerative colitis, unspecified, without complications: Secondary | ICD-10-CM

## 2021-05-12 DIAGNOSIS — R197 Diarrhea, unspecified: Secondary | ICD-10-CM | POA: Diagnosis not present

## 2021-05-12 DIAGNOSIS — K625 Hemorrhage of anus and rectum: Secondary | ICD-10-CM

## 2021-05-12 MED ORDER — CHOLESTYRAMINE 4 G PO PACK: 2.0000 g | PACK | Freq: Every day | ORAL | 5 refills | Status: DC

## 2021-05-12 NOTE — Progress Notes (Signed)
Primary Care Physician:  Monico Blitz, MD Primary Gastroenterologist:  Dr. Abbey Chatters  Chief Complaint  Patient presents with   Diarrhea    Last month. Was given Cipro and Flagyl but couldn't finish course d/t lip swelling. Started Questran and doing better    HPI:   Karen Simpson is a 70 y.o. female who presents to clinic today for follow-up visit for UC.  This was previously followed by Dr. Earley Brooke.  Was on sulfasalazine in the past though this was discontinued.  Not currently on any chronic medications.  Colonoscopy 06/2016: hemorrhoids, inflammation (erythema, edema, micro-ulcerations, more in the proximal 2/3 of the colon. Path "nonspecific chronic inflammation of cecum and rectum".    Per OV note: patient treated with welchol, questran. Symptoms improved on mesalamine. "Advised to stay on Azulfidine 2gm/day with folic acid".   Reports her sister had colon cancer in the past.  I performed repeat colonoscopy March 2022 which showed internal hemorrhoids, diverticulosis of the sigmoid descending colon, colon mucosa otherwise unremarkable.  Random colon biopsies did show focal active colitis.  Patient states she has been doing well until an acute onset of diarrhea at the beginning of October.  Prior to this she was having normal bowel movements.  During this flare she was having numerous watery bowel movements daily.  She was seen by her PCP who put her on Cipro and Flagyl.  No stool studies or imaging performed at that time.  Took this for almost the entire course though she notes some lip swelling so she stopped taking both antibiotics.  Unsure which one caused the issue.  She was then placed on Questran states she is much improved today.  Having normal bowel movements again.  No melena hematochezia.  No mucus in her stool.  No abdominal pain.  Does report intermittent mild bleeding with bowel movements on toilet paper only.  Takes Anusol which has helped.  Past Medical  History:  Diagnosis Date   SCC (squamous cell carcinoma), leg    Ulcerative colitis [facility])     Past Surgical History:  Procedure Laterality Date   BIOPSY  09/07/2020   Procedure: BIOPSY;  Surgeon: Eloise Harman, DO;  [facility];  Service: Endoscopy;;   CHOLECYSTECTOMY     COLONOSCOPY WITH PROPOFOL N/A 09/07/2020   Procedure: COLONOSCOPY WITH PROPOFOL;  Surgeon: Eloise Harman, DO;  [facility];  Service: Endoscopy;  Laterality: N/A;  am appt   EYE SURGERY Bilateral    cataract   TONSILLECTOMY      Current Outpatient Medications  Medication Sig Dispense Refill   aspirin EC 81 MG tablet Take 81 mg by mouth daily. Swallow whole.     Calcium-Magnesium-Vitamin D (CALCIUM 1200+D3 PO) Take 2 tablets by mouth daily. Takes 2 tablets daily.     cetirizine (ZYRTEC) 10 MG tablet Take 10 mg by mouth daily.     Cholecalciferol (VITAMIN D3) 50 MCG (2000 UT) TABS Take 2,000 Units by mouth daily.     CRANBERRY-VITAMIN C PO Take 2 tablets by mouth daily. 4200 mg per tab     gabapentin (NEURONTIN) 300 MG capsule Take 300 mg by mouth daily.     hydrocortisone (ANUSOL-HC) 2.5 % rectal cream Place 1 application rectally 2 (two) times daily. For 14 days. May repeat if needed. Do not use long-term. 30 g 1   Multiple Vitamins-Minerals (CENTRUM SILVER 50+WOMEN PO) Take 1 tablet by mouth daily.     Probiotic Product (PROBIOTIC  DAILY PO) Take by mouth daily.     vitamin E 180 MG (400 UNITS) capsule Take 400 Units by mouth daily.     cholestyramine (QUESTRAN) 4 g packet Take 0.5-1 packets (2-4 g total) by mouth daily. 30 packet 5   UNABLE TO FIND Take 1 tablet by mouth daily. Med Name: Vit C 60mg  and Zinc 15 mg (Patient not taking: Reported on 05/12/2021)     No current facility-administered medications for this visit.    Allergies as of 05/12/2021   (No Known Allergies)    Family History  Problem Relation Age of Onset   Colon cancer Sister    Stroke Brother     Social  History   Socioeconomic History   Marital status: Single    Spouse name: Not on file   Number of children: Not on file   Years of education: Not on file   Highest education level: Not on file  Occupational History   Not on file  Tobacco Use   Smoking status: Never   Smokeless tobacco: Never  Substance and Sexual Activity   Alcohol use: Not Currently   Drug use: Never   Sexual activity: Not on file  Other Topics Concern   Not on file  Social History Narrative   Not on file   Social Determinants of Health   Financial Resource Strain: Not on file  Food Insecurity: Not on file  Transportation Needs: Not on file  Physical Activity: Not on file  Stress: Not on file  Social Connections: Not on file  Intimate Partner Violence: Not on file    Subjective: Review of Systems  Constitutional:  Negative for chills and fever.  HENT:  Negative for congestion and hearing loss.   Eyes:  Negative for blurred vision and double vision.  Respiratory:  Negative for cough and shortness of breath.   Cardiovascular:  Negative for chest pain and palpitations.  Gastrointestinal:  Negative for abdominal pain, blood in stool, constipation, diarrhea, heartburn, melena and vomiting.  Genitourinary:  Negative for dysuria and urgency.  Musculoskeletal:  Negative for joint pain and myalgias.  Skin:  Negative for itching and rash.  Neurological:  Negative for dizziness and headaches.  Psychiatric/Behavioral:  Negative for depression. The patient is not nervous/anxious.       Objective: There were no vitals taken for this visit. Physical Exam Constitutional:      Appearance: Normal appearance.  HENT:     Head: Normocephalic and atraumatic.  Eyes:     Extraocular Movements: Extraocular movements intact.     Conjunctiva/sclera: Conjunctivae normal.  Cardiovascular:     Rate and Rhythm: Normal rate and regular rhythm.  Pulmonary:     Effort: Pulmonary effort is normal.     Breath sounds:  Normal breath sounds.  Abdominal:     General: Bowel sounds are normal.     Palpations: Abdomen is soft.  Musculoskeletal:        General: No swelling. Normal range of motion.     Cervical back: Normal range of motion and neck supple.  Skin:    General: Skin is warm and dry.     Coloration: Skin is not jaundiced.  Neurological:     General: No focal deficit present.     Mental Status: She is alert and oriented to person, place, and time.  Psychiatric:        Mood and Affect: Mood normal.        Behavior: Behavior normal.  Assessment: *Ulcerative colitis-mild *Rectal bleeding *Family history of colon cancer  Plan: Discussed patient's colonoscopy and path results in depth with her today.  Besides her recent flare in October, appears that her disease is quite mild.  Okay to trial off of Questran now that her diarrhea is resolved.  I did send in refills just in case she needs this medication long-term as she is status post cholecystectomy and may have a component of bile acid diarrhea.  Discussed potentially placing her on oral mesalamine given her positive biopsies though we will hold off for now.  If she has resurgence of her diarrhea, she will call our office and we will perform stool studies.  Otherwise we will plan a repeat colonoscopy in 2 years.  Continue on Anusol for hemorrhoids.  Follow-up in 4 months   05/12/2021 3:47 PM   Disclaimer: This note was dictated with voice recognition software. Similar sounding words can inadvertently be transcribed and may not be corrected upon review.

## 2021-05-12 NOTE — Patient Instructions (Signed)
For your diarrhea, I think it is reasonable to trial off of the Questran and see how you do.  I did send you in refills however if you need this medication.  We will continue to monitor your ulcerative colitis for now.  We will plan on repeat colonoscopy in 2 to 3 years.  If you have a resurgence of diarrhea, I want you to call the office and we will order stool studies to further evaluate.  Follow-up with GI in 4 months.  It was nice seeing you again today.    Dr. Abbey Chatters  At [facility] we value your feedback. You may receive a survey about your visit today. Please share your experience as we strive to create trusting relationships with our patients to provide genuine, compassionate, quality care.  We appreciate your understanding and patience as we review any laboratory studies, imaging, and other diagnostic tests that are ordered as we care for you. Our office policy is 5 business days for review of these results, and any emergent or urgent results are addressed in a timely manner for your best interest. If you do not hear from our office in 1 week, please contact us.   We also encourage the use of MyChart, which contains your medical information for your review as well. If you are not enrolled in this feature, an access code is on this after visit summary for your convenience. Thank you for allowing [place] to be involved in your care.  It was great to see you today!  I hope you have a great rest of your Fall!    Karen Simpson. Abbey Chatters, D.O. Gastroenterology and Hepatology [facility]

## 2021-06-23 DIAGNOSIS — Z6828 Body mass index (BMI) 28.0-28.9, adult: Secondary | ICD-10-CM | POA: Diagnosis not present

## 2021-06-23 DIAGNOSIS — Z79899 Other long term (current) drug therapy: Secondary | ICD-10-CM | POA: Diagnosis not present

## 2021-06-23 DIAGNOSIS — Z0000 Encounter for general adult medical examination without abnormal findings: Secondary | ICD-10-CM | POA: Diagnosis not present

## 2021-06-23 DIAGNOSIS — Z789 Other specified health status: Secondary | ICD-10-CM | POA: Diagnosis not present

## 2021-06-23 DIAGNOSIS — E559 Vitamin D deficiency, unspecified: Secondary | ICD-10-CM | POA: Diagnosis not present

## 2021-06-23 DIAGNOSIS — E7800 Pure hypercholesterolemia, unspecified: Secondary | ICD-10-CM | POA: Diagnosis not present

## 2021-06-23 DIAGNOSIS — Z1331 Encounter for screening for depression: Secondary | ICD-10-CM | POA: Diagnosis not present

## 2021-06-23 DIAGNOSIS — Z7189 Other specified counseling: Secondary | ICD-10-CM | POA: Diagnosis not present

## 2021-06-23 DIAGNOSIS — Z1339 Encounter for screening examination for other mental health and behavioral disorders: Secondary | ICD-10-CM | POA: Diagnosis not present

## 2021-06-23 DIAGNOSIS — R5383 Other fatigue: Secondary | ICD-10-CM | POA: Diagnosis not present

## 2021-06-23 DIAGNOSIS — Z299 Encounter for prophylactic measures, unspecified: Secondary | ICD-10-CM | POA: Diagnosis not present

## 2021-08-25 ENCOUNTER — Other Ambulatory Visit: Payer: Self-pay

## 2021-08-25 ENCOUNTER — Other Ambulatory Visit: Payer: Self-pay | Admitting: Internal Medicine

## 2021-08-25 DIAGNOSIS — Z1231 Encounter for screening mammogram for malignant neoplasm of breast: Secondary | ICD-10-CM

## 2021-08-26 ENCOUNTER — Encounter: Payer: Self-pay | Admitting: Internal Medicine

## 2021-09-07 ENCOUNTER — Other Ambulatory Visit: Payer: Self-pay

## 2021-09-07 ENCOUNTER — Ambulatory Visit (INDEPENDENT_AMBULATORY_CARE_PROVIDER_SITE_OTHER): Payer: Medicare Other | Admitting: Gastroenterology

## 2021-09-07 ENCOUNTER — Encounter: Payer: Self-pay | Admitting: Gastroenterology

## 2021-09-07 VITALS — BP 138/72 | HR 63 | Temp 97.5°F | Ht 62.0 in | Wt 156.0 lb

## 2021-09-07 DIAGNOSIS — K5190 Ulcerative colitis, unspecified, without complications: Secondary | ICD-10-CM | POA: Diagnosis not present

## 2021-09-07 NOTE — Progress Notes (Signed)
? ? ? ?GI Office Note   ? ?Referring Provider: Monico Blitz, MD ?Primary Care Physician:  Monico Blitz, MD  ?Primary Gastroenterologist: Elon Alas. Abbey Chatters, DO ? ? ?Chief Complaint  ? ?Chief Complaint  ?Patient presents with  ? Follow-up  ?  No current issues  ? ? ?History of Present Illness  ? ?Karen Simpson is a 71 y.o. female presenting today for follow-up.  Last seen in November 2022.  She has a history of UC.  Previously followed by Dr.Pandya.  Previously on sulfasalazine in the past.  Colonoscopy January 2018 showed nonspecific chronic inflammation of the cecum and rectum on pathology.  Endoscopically she had erythema/edema/micro ulcerations more in the proximal two thirds of the colon.  Treated with mesalamine.  She reports sister had colon cancer. ? ?Last colonoscopy was completed here in March 2022 showed internal hemorrhoids, diverticulosis of the sigmoid and descending colon, otherwise colonic mucosa unremarkable.  Random colon biopsies showed focal active colitis.  Plans for colonoscopy in March 2024. ? ?Last office visit she was on Questran for persistent diarrhea that developed in October.  Initially treated with Cipro and Flagyl empirically by PCP.  She developed lip swelling so she stopped both antibiotics.  Unsure which one caused the issue.  After that she was placed on Questran.  At time of last office visit her diarrhea had resolved, therefore she was advised to stop Questran and use only as needed. ? ?Today: Patient states she has been doing very well.  She is now on Align daily.  Bowel movements are regular.  No blood in the stool or abdominal pain.  Appetite is good.  No weight loss.  No upper GI symptoms.  She is scheduled for a bone density study in the near future, ordered by PCP.  She reports recent physical with unremarkable labs. ?  ?Labs from January 2023 ordered by PCP: White blood cell count 5500, hemoglobin 13.7, platelets 311,000, glucose 96, creatinine 0.81, albumin 4.8,  total bilirubin 0.4, alkaline phosphatase 89, AST 30, ALT 24, TSH 0.925, vitamin D 25-hydroxy 71.7 ? ?Medications  ? ?Current Outpatient Medications  ?Medication Sig Dispense Refill  ? aspirin EC 81 MG tablet Take 81 mg by mouth daily. Swallow whole.    ? Calcium-Magnesium-Vitamin D (CALCIUM 1200+D3 PO) Take 2 tablets by mouth daily. Takes 2 tablets daily.    ? cetirizine (ZYRTEC) 10 MG tablet Take 10 mg by mouth daily.    ? Cholecalciferol (VITAMIN D3) 50 MCG (2000 UT) TABS Take 2,000 Units by mouth daily.    ? cholestyramine (QUESTRAN) 4 g packet Take 0.5-1 packets (2-4 g total) by mouth daily. (Patient taking differently: Take 2-4 g by mouth daily. As needed) 30 packet 5  ? CRANBERRY-VITAMIN C PO Take 2 tablets by mouth daily. 4200 mg per tab    ? gabapentin (NEURONTIN) 300 MG capsule Take 300 mg by mouth daily.    ? hydrocortisone (ANUSOL-HC) 2.5 % rectal cream Place 1 application rectally 2 (two) times daily. For 14 days. May repeat if needed. Do not use long-term. (Patient taking differently: Place 1 application. rectally 2 (two) times daily. For 14 days. May repeat if needed. Do not use long-term. AS NEEDED) 30 g 1  ? Multiple Vitamins-Minerals (CENTRUM SILVER 50+WOMEN PO) Take 1 tablet by mouth daily.    ? Probiotic Product (PROBIOTIC DAILY PO) Take by mouth daily.    ? UNABLE TO FIND Take 1 tablet by mouth daily. Med Name: Vit C '60mg'$  and  Zinc 15 mg    ? vitamin E 180 MG (400 UNITS) capsule Take 400 Units by mouth daily.    ? ?No current facility-administered medications for this visit.  ? ? ?Allergies  ? ?Allergies as of 09/07/2021 - Review Complete 09/07/2021  ?Allergen Reaction Noted  ? Ciprofloxacin  09/07/2021  ? Flagyl [metronidazole]  09/07/2021  ? ?  ?Review of Systems  ? ?General: Negative for anorexia, weight loss, fever, chills, fatigue, weakness. ?ENT: Negative for hoarseness, difficulty swallowing , nasal congestion. ?CV: Negative for chest pain, angina, palpitations, dyspnea on exertion,  peripheral edema.  ?Respiratory: Negative for dyspnea at rest, dyspnea on exertion, cough, sputum, wheezing.  ?GI: See history of present illness. ?GU:  Negative for dysuria, hematuria, urinary incontinence, urinary frequency, nocturnal urination.  ?Endo: Negative for unusual weight change.  ?   ?Physical Exam  ? ?BP 138/72 (BP Location: Right Arm, Patient Position: Sitting, Cuff Size: Normal)   Pulse 63   Temp (!) 97.5 ?F (36.4 ?C) (Temporal)   Ht '5\' 2"'$  (1.575 m)   Wt 156 lb (70.8 kg)   SpO2 96%   BMI 28.53 kg/m?  ?  ?General: Well-nourished, well-developed in no acute distress.  ?Eyes: No icterus. ?Mouth: Oropharyngeal mucosa moist and pink , no lesions erythema or exudate. ?Lungs: Clear to auscultation bilaterally.  ?Heart: Regular rate and rhythm, no murmurs rubs or gallops.  ?Abdomen: Bowel sounds are normal, nontender, nondistended, no hepatosplenomegaly or masses,  ?no abdominal bruits or hernia , no rebound or guarding.  ?Rectal: not performed ?Extremities: No lower extremity edema. No clubbing or deformities. ?Neuro: Alert and oriented x 4   ?Skin: Warm and dry, no jaundice.   ?Psych: Alert and cooperative, normal mood and affect. ? ?Labs  ? ?See hpi ? ?Imaging Studies  ? ?No results found. ? ?Assessment  ? ?Ulcerative colitis: Continues to do well.  No longer on Questran.  Last colonoscopy 1 year ago, endoscopically no inflammation but on random colon biopsies she had some focal active colitis.  Typically has not been on chronic maintenance medication long-term.  Use oral mesalamine was discussed given biopsy findings but decision was made to hold off and follow clinically.  Plans to repeat colonoscopy March 2024. ? ?PLAN  ? ?Return to the office in 6 months. ?Bone density study is ordered by PCP. ?Colonoscopy will be due in March 2024. ?She will call in the interim with any questions or concerns. ?He can continue probiotic daily and Questran as needed. ? ? ?Laureen Ochs. Patte Winkel, MHS, PA-C ?[facility] ? ?

## 2021-09-07 NOTE — Patient Instructions (Addendum)
Continue Questran powder only as needed. ?Continue probiotic. ?We will see you back in six months.  ?Your next colonoscopy will be due in 08/2022. ?

## 2021-09-13 DIAGNOSIS — E2839 Other primary ovarian failure: Secondary | ICD-10-CM | POA: Diagnosis not present

## 2021-09-22 ENCOUNTER — Ambulatory Visit
Admission: RE | Admit: 2021-09-22 | Discharge: 2021-09-22 | Disposition: A | Discharge status: Home / Self Care | Payer: Medicare Other | Source: Ambulatory Visit | Attending: Internal Medicine | Admitting: Internal Medicine

## 2021-09-22 DIAGNOSIS — Z1231 Encounter for screening mammogram for malignant neoplasm of breast: Secondary | ICD-10-CM | POA: Diagnosis not present

## 2021-09-24 ENCOUNTER — Other Ambulatory Visit: Payer: Self-pay | Admitting: Internal Medicine

## 2021-09-24 DIAGNOSIS — R928 Other abnormal and inconclusive findings on diagnostic imaging of breast: Secondary | ICD-10-CM

## 2021-10-06 DIAGNOSIS — L570 Actinic keratosis: Secondary | ICD-10-CM | POA: Diagnosis not present

## 2021-10-06 DIAGNOSIS — C44729 Squamous cell carcinoma of skin of left lower limb, including hip: Secondary | ICD-10-CM | POA: Diagnosis not present

## 2021-10-06 DIAGNOSIS — L814 Other melanin hyperpigmentation: Secondary | ICD-10-CM | POA: Diagnosis not present

## 2021-10-06 DIAGNOSIS — Z85828 Personal history of other malignant neoplasm of skin: Secondary | ICD-10-CM | POA: Diagnosis not present

## 2021-10-06 DIAGNOSIS — D485 Neoplasm of uncertain behavior of skin: Secondary | ICD-10-CM | POA: Diagnosis not present

## 2021-10-06 DIAGNOSIS — C44329 Squamous cell carcinoma of skin of other parts of face: Secondary | ICD-10-CM | POA: Diagnosis not present

## 2021-10-06 DIAGNOSIS — D229 Melanocytic nevi, unspecified: Secondary | ICD-10-CM | POA: Diagnosis not present

## 2021-10-06 DIAGNOSIS — C44629 Squamous cell carcinoma of skin of left upper limb, including shoulder: Secondary | ICD-10-CM | POA: Diagnosis not present

## 2021-10-06 DIAGNOSIS — Z08 Encounter for follow-up examination after completed treatment for malignant neoplasm: Secondary | ICD-10-CM | POA: Diagnosis not present

## 2021-10-06 DIAGNOSIS — L821 Other seborrheic keratosis: Secondary | ICD-10-CM | POA: Diagnosis not present

## 2021-10-06 DIAGNOSIS — R229 Localized swelling, mass and lump, unspecified: Secondary | ICD-10-CM | POA: Diagnosis not present

## 2021-10-11 ENCOUNTER — Ambulatory Visit
Admission: RE | Admit: 2021-10-11 | Discharge: 2021-10-11 | Disposition: A | Discharge status: Home / Self Care | Payer: Medicare Other | Source: Ambulatory Visit | Attending: Internal Medicine | Admitting: Internal Medicine

## 2021-10-11 ENCOUNTER — Ambulatory Visit: Payer: Medicare Other

## 2021-10-11 DIAGNOSIS — R928 Other abnormal and inconclusive findings on diagnostic imaging of breast: Secondary | ICD-10-CM

## 2021-10-11 DIAGNOSIS — R922 Inconclusive mammogram: Secondary | ICD-10-CM | POA: Diagnosis not present

## 2021-12-15 DIAGNOSIS — R202 Paresthesia of skin: Secondary | ICD-10-CM | POA: Diagnosis not present

## 2021-12-15 DIAGNOSIS — Z299 Encounter for prophylactic measures, unspecified: Secondary | ICD-10-CM | POA: Diagnosis not present

## 2021-12-15 DIAGNOSIS — K5190 Ulcerative colitis, unspecified, without complications: Secondary | ICD-10-CM | POA: Diagnosis not present

## 2021-12-17 DIAGNOSIS — D0439 Carcinoma in situ of skin of other parts of face: Secondary | ICD-10-CM | POA: Diagnosis not present

## 2022-02-08 DIAGNOSIS — J309 Allergic rhinitis, unspecified: Secondary | ICD-10-CM | POA: Diagnosis not present

## 2022-02-08 DIAGNOSIS — Z20822 Contact with and (suspected) exposure to covid-19: Secondary | ICD-10-CM | POA: Diagnosis not present

## 2022-02-08 DIAGNOSIS — J029 Acute pharyngitis, unspecified: Secondary | ICD-10-CM | POA: Diagnosis not present

## 2022-02-16 DIAGNOSIS — Z299 Encounter for prophylactic measures, unspecified: Secondary | ICD-10-CM | POA: Diagnosis not present

## 2022-02-16 DIAGNOSIS — Z23 Encounter for immunization: Secondary | ICD-10-CM | POA: Diagnosis not present

## 2022-02-16 DIAGNOSIS — Z6830 Body mass index (BMI) 30.0-30.9, adult: Secondary | ICD-10-CM | POA: Diagnosis not present

## 2022-02-16 DIAGNOSIS — M25552 Pain in left hip: Secondary | ICD-10-CM | POA: Diagnosis not present

## 2022-02-16 DIAGNOSIS — Z713 Dietary counseling and surveillance: Secondary | ICD-10-CM | POA: Diagnosis not present

## 2022-02-16 DIAGNOSIS — M24152 Other articular cartilage disorders, left hip: Secondary | ICD-10-CM | POA: Diagnosis not present

## 2022-02-16 DIAGNOSIS — J309 Allergic rhinitis, unspecified: Secondary | ICD-10-CM | POA: Diagnosis not present

## 2022-02-25 DIAGNOSIS — Z713 Dietary counseling and surveillance: Secondary | ICD-10-CM | POA: Diagnosis not present

## 2022-02-25 DIAGNOSIS — Z299 Encounter for prophylactic measures, unspecified: Secondary | ICD-10-CM | POA: Diagnosis not present

## 2022-02-25 DIAGNOSIS — Z6831 Body mass index (BMI) 31.0-31.9, adult: Secondary | ICD-10-CM | POA: Diagnosis not present

## 2022-02-25 DIAGNOSIS — M25552 Pain in left hip: Secondary | ICD-10-CM | POA: Diagnosis not present

## 2022-03-14 NOTE — Progress Notes (Signed)
GI Office Note    Referring Provider: Monico Blitz, MD Primary Care Physician:  Monico Blitz, MD  Primary Gastroenterologist: Elon Alas. Abbey Chatters, DO   Chief Complaint   Chief Complaint  Patient presents with   Follow-up    Doing well    History of Present Illness   Karen Simpson is a 71 y.o. female presenting today for follow up of UC. Last seen in 08/2021. Previously followed by Dr.Pandya.  Previously on sulfasalazine in the past.  Colonoscopy January 2018 showed nonspecific chronic inflammation of the cecum and rectum on pathology.  Endoscopically she had erythema/edema/microulcerations more in the proximal two thirds of the colon.  Treated with mesalamine. She reports sister had colon cancer.  Last colonoscopy was completed here in March 2022 showed internal hemorrhoids, diverticulosis of the sigmoid and descending colon, otherwise colonic mucosa unremarkable.  Random colon biopsies showed focal active colitis.  Plans for colonoscopy in March 2024.  Typically has not been on chronic maintenance medication long-term.  Use of oral mesalamine was discussed given biopsy findings but decision was made to hold off and follow clinically.   Today: Doing well.  BMs regular, typically twice daily.  Mostly solid stools.  Rarely takes Questran.  Continues probiotics.  No melena, brbpr. Has been on meloxicam for less than month for left hip/groin pain. Pain improved.  Taking collagen peptides for her knees.  Has gained about 13 pounds since her last visit.  Appetite increased.  No upper GI symptoms.  No abdominal pain.   Medications   Current Outpatient Medications  Medication Sig Dispense Refill   aspirin EC 81 MG tablet Take 81 mg by mouth daily. Swallow whole.     Calcium-Magnesium-Vitamin D (CALCIUM 1200+D3 PO) Take 2 tablets by mouth daily. Takes 2 tablets daily.     cetirizine (ZYRTEC) 10 MG tablet Take 10 mg by mouth daily.     Cholecalciferol (VITAMIN D3) 50 MCG (2000 UT) TABS Take  2,000 Units by mouth daily.     cholestyramine (QUESTRAN) 4 g packet Take 0.5-1 packets (2-4 g total) by mouth daily. (Patient taking differently: Take 2-4 g by mouth daily. As needed) 30 packet 5   CRANBERRY-VITAMIN C PO Take 2 tablets by mouth daily. 4200 mg per tab     gabapentin (NEURONTIN) 300 MG capsule Take 300 mg by mouth daily.     hydrocortisone (ANUSOL-HC) 2.5 % rectal cream Place 1 application rectally 2 (two) times daily. For 14 days. May repeat if needed. Do not use long-term. (Patient taking differently: Place 1 application  rectally 2 (two) times daily. For 14 days. May repeat if needed. Do not use long-term. AS NEEDED) 30 g 1   meloxicam (MOBIC) 15 MG tablet Take 15 mg by mouth daily.     Multiple Vitamins-Minerals (CENTRUM SILVER 50+WOMEN PO) Take 1 tablet by mouth daily.     Probiotic Product (PROBIOTIC DAILY PO) Take by mouth daily.     vitamin E 180 MG (400 UNITS) capsule Take 400 Units by mouth daily.     No current facility-administered medications for this visit.    Allergies   Allergies as of 03/15/2022 - Review Complete 03/15/2022  Allergen Reaction Noted   Ciprofloxacin  09/07/2021   Flagyl [metronidazole]  09/07/2021       Review of Systems   General: Negative for anorexia, weight loss, fever, chills, fatigue, weakness. ENT: Negative for hoarseness, difficulty swallowing , nasal congestion. CV: Negative for chest pain, angina, palpitations, dyspnea  on exertion, peripheral edema.  Respiratory: Negative for dyspnea at rest, dyspnea on exertion, cough, sputum, wheezing.  GI: See history of present illness. GU:  Negative for dysuria, hematuria, urinary incontinence, urinary frequency, nocturnal urination.  Endo: Negative for unusual weight change.     Physical Exam   BP 116/79 (BP Location: Right Arm, Patient Position: Sitting, Cuff Size: Normal)   Pulse 77   Temp 98.3 F (36.8 C) (Oral)   Ht 5' 2"  (1.575 m)   Wt 169 lb 12.8 oz (77 kg)   SpO2 96%   BMI  31.06 kg/m    General: Well-nourished, well-developed in no acute distress.  Eyes: No icterus. Mouth: Oropharyngeal mucosa moist and pink ,   Abdomen: Bowel sounds are normal, nontender, nondistended, no hepatosplenomegaly or masses,  no abdominal bruits or hernia , no rebound or guarding.  Rectal: Not performed Extremities: Trace bilateral lower extremity edema. No clubbing or deformities. Neuro: Alert and oriented x 4   Skin: Warm and dry, no jaundice.   Psych: Alert and cooperative, normal mood and affect.  Labs   none  Imaging Studies   No results found.  Assessment   UC: Continues to do well.  Typically has not been on chronic maintenance medication long-term.  Patient decided to hold off on oral mesalamine given lack of symptoms and colonoscopy findings in February 2022.    PLAN   Plans for colonoscopy in 2024, schedule at time of next visit. Continue Questran as needed. Continue probiotic daily as needed. Monitor for any change in bowels, abdominal pain, blood in stool.  Cautioned regarding meloxicam use. Return to office in 6 months.   Laureen Ochs. Bobby Rumpf, [place], [facility]

## 2022-03-15 ENCOUNTER — Ambulatory Visit (INDEPENDENT_AMBULATORY_CARE_PROVIDER_SITE_OTHER): Payer: Medicare Other | Admitting: Gastroenterology

## 2022-03-15 ENCOUNTER — Encounter: Payer: Self-pay | Admitting: Gastroenterology

## 2022-03-15 VITALS — BP 116/79 | HR 77 | Temp 98.3°F | Ht 62.0 in | Wt 169.8 lb

## 2022-03-15 DIAGNOSIS — K5190 Ulcerative colitis, unspecified, without complications: Secondary | ICD-10-CM | POA: Diagnosis not present

## 2022-03-15 NOTE — Patient Instructions (Signed)
Continue to monitor for change in stools, abdominal pain, or blood in the stool. Let us know if you have any issues.  We will see you back in six months and schedule your colonoscopy at that time.

## 2022-03-18 DIAGNOSIS — R070 Pain in throat: Secondary | ICD-10-CM | POA: Diagnosis not present

## 2022-03-18 DIAGNOSIS — Z299 Encounter for prophylactic measures, unspecified: Secondary | ICD-10-CM | POA: Diagnosis not present

## 2022-03-18 DIAGNOSIS — J069 Acute upper respiratory infection, unspecified: Secondary | ICD-10-CM | POA: Diagnosis not present

## 2022-04-13 DIAGNOSIS — Z08 Encounter for follow-up examination after completed treatment for malignant neoplasm: Secondary | ICD-10-CM | POA: Diagnosis not present

## 2022-04-13 DIAGNOSIS — L570 Actinic keratosis: Secondary | ICD-10-CM | POA: Diagnosis not present

## 2022-04-13 DIAGNOSIS — L821 Other seborrheic keratosis: Secondary | ICD-10-CM | POA: Diagnosis not present

## 2022-04-13 DIAGNOSIS — D1801 Hemangioma of skin and subcutaneous tissue: Secondary | ICD-10-CM | POA: Diagnosis not present

## 2022-04-13 DIAGNOSIS — C44329 Squamous cell carcinoma of skin of other parts of face: Secondary | ICD-10-CM | POA: Diagnosis not present

## 2022-04-13 DIAGNOSIS — L814 Other melanin hyperpigmentation: Secondary | ICD-10-CM | POA: Diagnosis not present

## 2022-04-13 DIAGNOSIS — R229 Localized swelling, mass and lump, unspecified: Secondary | ICD-10-CM | POA: Diagnosis not present

## 2022-04-13 DIAGNOSIS — Z85828 Personal history of other malignant neoplasm of skin: Secondary | ICD-10-CM | POA: Diagnosis not present

## 2022-04-18 DIAGNOSIS — N281 Cyst of kidney, acquired: Secondary | ICD-10-CM | POA: Diagnosis not present

## 2022-04-27 DIAGNOSIS — H35373 Puckering of macula, bilateral: Secondary | ICD-10-CM | POA: Diagnosis not present

## 2022-04-27 DIAGNOSIS — H26492 Other secondary cataract, left eye: Secondary | ICD-10-CM | POA: Diagnosis not present

## 2022-05-10 DIAGNOSIS — N281 Cyst of kidney, acquired: Secondary | ICD-10-CM | POA: Diagnosis not present

## 2022-06-14 DIAGNOSIS — R509 Fever, unspecified: Secondary | ICD-10-CM | POA: Diagnosis not present

## 2022-06-14 DIAGNOSIS — Z299 Encounter for prophylactic measures, unspecified: Secondary | ICD-10-CM | POA: Diagnosis not present

## 2022-06-14 DIAGNOSIS — J209 Acute bronchitis, unspecified: Secondary | ICD-10-CM | POA: Diagnosis not present

## 2022-06-14 DIAGNOSIS — Z789 Other specified health status: Secondary | ICD-10-CM | POA: Diagnosis not present

## 2022-06-14 DIAGNOSIS — Z6831 Body mass index (BMI) 31.0-31.9, adult: Secondary | ICD-10-CM | POA: Diagnosis not present

## 2022-06-14 DIAGNOSIS — R5383 Other fatigue: Secondary | ICD-10-CM | POA: Diagnosis not present

## 2022-06-24 DIAGNOSIS — R5383 Other fatigue: Secondary | ICD-10-CM | POA: Diagnosis not present

## 2022-06-24 DIAGNOSIS — E7800 Pure hypercholesterolemia, unspecified: Secondary | ICD-10-CM | POA: Diagnosis not present

## 2022-06-24 DIAGNOSIS — E559 Vitamin D deficiency, unspecified: Secondary | ICD-10-CM | POA: Diagnosis not present

## 2022-06-24 DIAGNOSIS — K5190 Ulcerative colitis, unspecified, without complications: Secondary | ICD-10-CM | POA: Diagnosis not present

## 2022-06-24 DIAGNOSIS — Z1331 Encounter for screening for depression: Secondary | ICD-10-CM | POA: Diagnosis not present

## 2022-06-24 DIAGNOSIS — Z299 Encounter for prophylactic measures, unspecified: Secondary | ICD-10-CM | POA: Diagnosis not present

## 2022-06-24 DIAGNOSIS — Z79899 Other long term (current) drug therapy: Secondary | ICD-10-CM | POA: Diagnosis not present

## 2022-06-24 DIAGNOSIS — Z7189 Other specified counseling: Secondary | ICD-10-CM | POA: Diagnosis not present

## 2022-06-24 DIAGNOSIS — Z0000 Encounter for general adult medical examination without abnormal findings: Secondary | ICD-10-CM | POA: Diagnosis not present

## 2022-06-24 DIAGNOSIS — Z1339 Encounter for screening examination for other mental health and behavioral disorders: Secondary | ICD-10-CM | POA: Diagnosis not present

## 2022-06-27 DIAGNOSIS — Z299 Encounter for prophylactic measures, unspecified: Secondary | ICD-10-CM | POA: Diagnosis not present

## 2022-06-27 DIAGNOSIS — R5383 Other fatigue: Secondary | ICD-10-CM | POA: Diagnosis not present

## 2022-06-27 DIAGNOSIS — R000 Tachycardia, unspecified: Secondary | ICD-10-CM | POA: Diagnosis not present

## 2022-06-27 DIAGNOSIS — J069 Acute upper respiratory infection, unspecified: Secondary | ICD-10-CM | POA: Diagnosis not present

## 2022-07-06 ENCOUNTER — Other Ambulatory Visit: Payer: Self-pay | Admitting: Gastroenterology

## 2022-07-06 ENCOUNTER — Encounter: Payer: Self-pay | Admitting: Gastroenterology

## 2022-07-06 ENCOUNTER — Ambulatory Visit (INDEPENDENT_AMBULATORY_CARE_PROVIDER_SITE_OTHER): Payer: Medicare Other | Admitting: Gastroenterology

## 2022-07-06 VITALS — BP 139/84 | HR 75 | Temp 97.2°F | Ht 62.0 in | Wt 166.8 lb

## 2022-07-06 DIAGNOSIS — R197 Diarrhea, unspecified: Secondary | ICD-10-CM | POA: Insufficient documentation

## 2022-07-06 DIAGNOSIS — A09 Infectious gastroenteritis and colitis, unspecified: Secondary | ICD-10-CM

## 2022-07-06 NOTE — Patient Instructions (Addendum)
Please collect stool test as soon as you can. You will need to use [facility]. There should be one located at [address] [place] [address] We will be in touch with results as soon as available.   It was a pleasure to see you today. I want to create trusting relationships with patients and provide genuine, compassionate, and quality care. I truly value your feedback, so please be on the lookout for a survey regarding your visit with me today. I appreciate your time in completing this!

## 2022-07-06 NOTE — Progress Notes (Signed)
GI Office Note    Referring Provider: Monico Blitz, MD Primary Care Physician:  Monico Blitz, MD  Primary Gastroenterologist: Elon Alas. Abbey Chatters, DO `  Chief Complaint   Chief Complaint  Patient presents with   Diarrhea    Been going on since around Dec. 26. Was given antibiotics from PCP for it.     History of Present Illness   Karen Simpson is a 72 y.o. female presenting today for diarrhea.  Last seen in October.  She has a history of ulcerative colitis.  Previously followed by Dr.Pandya.  Previously on sulfasalazine in the past.  Colonoscopy January 2018 showed nonspecific chronic inflammation of the cecum and rectum on pathology.  Endoscopically she had erythema/edema/microulcerations more in the proximal two thirds of the colon.  Treated with mesalamine. She reports sister had colon cancer.  Last colonoscopy was completed here in March 2022 showed internal hemorrhoids, diverticulosis of the sigmoid and descending colon, otherwise colonic mucosa unremarkable.  Random colon biopsies showed focal active colitis.  Plans for colonoscopy in March 2024.   Historically she has not been on any chronic medication for UC.  Use of oral mesalamine discussed after biopsies in March 2022.  Decision was made to hold off and follow clinically.  At time of last office visit she had noted that she had started meloxicam 1 month prior for left hip/groin pain.  She has been using Questran as needed for diarrhea.  Today: Patient states she was doing well until December 26, she went out to eat and ate [place].  While at the restaurant she developed diarrhea.  No one else significant sick.  Initially had some left-sided abdominal cramping.  Stool malodorous.  After about 5 to 6 days of bad diarrhea, she restarted Questran 4 g daily.  After about 4 days she developed constipation and stopped the medication.  Within 24 hours diarrhea returned.  Denies any blood in the stool or melena.  Stools are very loose  and look like sawdust.  She showed multiple pictures.  She is lost 7 pounds in the past month.  She saw her PCP early January for congestion, given 5-day course of azithromycin.  On January 12 she saw her PCP again for physical, due to ongoing congestion she was given a course of Augmentin.  Also received RSV and tetanus shots.  Notes that she ran out of probiotics for a while, restarted yesterday.  Completed Augmentin on Monday.     Medications   Current Outpatient Medications  Medication Sig Dispense Refill   aspirin EC 81 MG tablet Take 81 mg by mouth daily. Swallow whole.     gabapentin (NEURONTIN) 300 MG capsule Take 300 mg by mouth daily.     hydrocortisone (ANUSOL-HC) 2.5 % rectal cream Place 1 application rectally 2 (two) times daily. For 14 days. May repeat if needed. Do not use long-term. (Patient taking differently: Place 1 application  rectally 2 (two) times daily. For 14 days. May repeat if needed. Do not use long-term. AS NEEDED) 30 g 1   meloxicam (MOBIC) 15 MG tablet Take 15 mg by mouth daily.     Multiple Vitamins-Minerals (CENTRUM SILVER 50+WOMEN PO) Take 1 tablet by mouth daily.     vitamin E 180 MG (400 UNITS) capsule Take 400 Units by mouth daily.     cholestyramine (QUESTRAN) 4 g packet Take 0.5-1 packets (2-4 g total) by mouth daily. (Patient not taking: Reported on 07/06/2022) 30 packet 5   No  current facility-administered medications for this visit.    Allergies   Allergies as of 07/06/2022 - Review Complete 03/15/2022  Allergen Reaction Noted   Ciprofloxacin  09/07/2021   Flagyl [metronidazole]  09/07/2021     Review of Systems   General: Negative for anorexia,  fever, chills, fatigue, weakness.  See HPI ENT: Negative for hoarseness, difficulty swallowing , nasal congestion. CV: Negative for chest pain, angina, palpitations, dyspnea on exertion, peripheral edema.  Respiratory: Negative for dyspnea at rest, dyspnea on exertion, cough, sputum, wheezing.  GI:  See history of present illness. GU:  Negative for dysuria, hematuria, urinary incontinence, urinary frequency, nocturnal urination.  Endo: Negative for unusual weight change.     Physical Exam   BP 139/84 (BP Location: Right Arm, Patient Position: Sitting, Cuff Size: Large)   Pulse 75   Temp (!) 97.2 F (36.2 C) (Oral)   Ht '5\' 2"'$  (1.575 m)   Wt 166 lb 12.8 oz (75.7 kg)   SpO2 98%   BMI 30.51 kg/m    General: Well-nourished, well-developed in no acute distress.  Eyes: No icterus. Mouth: Oropharyngeal mucosa moist and pink. Lungs: Clear to auscultation bilaterally.  Heart: Regular rate and rhythm, no murmurs rubs or gallops.  Abdomen: Bowel sounds are normal, nontender, nondistended, no hepatosplenomegaly or masses,  no abdominal bruits or hernia , no rebound or guarding.  Rectal: not performed Extremities: No lower extremity edema. No clubbing or deformities. Neuro: Alert and oriented x 4   Skin: Warm and dry, no jaundice.   Psych: Alert and cooperative, normal mood and affect.  Labs   Labs from January 06/02/2023: White blood cell count 9400, hemoglobin 14.1, MCV 89, platelets 303,000, glucose 96, BUN 13, creatinine 0.96, albumin 4.9, total bilirubin 0.4, alkaline phosphatase 93, AST 26, ALT 19, vitamin D 103, TSH 1.820.  Imaging Studies   No results found.  Assessment   Diarrhea: history UC but historically has not had significant flares or been on chronic regimen. Recent acute onset diarrhea persisting for nearly one month. No significant abdominal pain. Weight loss noted. Since onset of diarrhea, she has had azithromycin and augmentin. This may be flare of UC but need to exclude infectious etiology.    PLAN   Stool for gi profile, cdiff. If positive, then treat. If negative stool studies, we will give short course of prednisone.  Continue probiotic. Plans for colonoscopy in the next couple of months, to discuss timing based on pending results.    Laureen Ochs. Bobby Rumpf,  [place], [facility]

## 2022-07-10 LAB — GI PROFILE, STOOL, PCR

## 2022-07-10 LAB — C DIFFICILE, CYTOTOXIN B

## 2022-07-10 LAB — C DIFFICILE TOXINS A+B W/RFLX: C difficile Toxins A+B, EIA: NEGATIVE

## 2022-07-15 ENCOUNTER — Telehealth: Payer: Self-pay

## 2022-07-15 NOTE — Telephone Encounter (Signed)
Pt called with an update. Pt states that her stools are starting to get better. Pt inquired about being contagious. Instructed pt to make sure she washes her hands with soap and water to prevent any potential spread. Pt verbalized understanding.

## 2022-07-18 NOTE — Telephone Encounter (Signed)
Agree with recommendations provided.   We should plan for updating colonoscopy for history of UC, high risk surveillance.  Let's plan to do colonoscopy in 3-4 weeks. ASA 2. She will need to hold questran 4 days before bowel prep. Hold vitamin E 10 days before procedure.

## 2022-07-18 NOTE — Telephone Encounter (Signed)
Pt was made aware of recommendations and is ready to move forward with scheduling.

## 2022-07-19 MED ORDER — PEG 3350-KCL-NA BICARB-NACL 420 G PO SOLR: 4000.0000 mL | Freq: Once | ORAL | 0 refills | Status: AC

## 2022-07-19 NOTE — Telephone Encounter (Signed)
Spoke with pt. She has been scheduled for 3/5 with Dr. Abbey Chatters. Aware will send instructions to her. Rx for prep to the pharmacy. Aware when to hold vot e and [place]

## 2022-07-19 NOTE — Addendum Note (Signed)
Addended by: Cheron Every on: 07/19/2022 04:04 PM   Modules accepted: Orders

## 2022-07-25 ENCOUNTER — Telehealth: Payer: Self-pay | Admitting: *Deleted

## 2022-07-25 NOTE — Telephone Encounter (Signed)
Pt's instructions were sent to her and she can't open them on her phone. She says she would like a paper copy mailed to her. Instructions placed in mail.

## 2022-08-16 ENCOUNTER — Ambulatory Visit (HOSPITAL_BASED_OUTPATIENT_CLINIC_OR_DEPARTMENT_OTHER): Payer: Medicare Other | Admitting: Anesthesiology

## 2022-08-16 ENCOUNTER — Encounter (HOSPITAL_COMMUNITY): Payer: Self-pay

## 2022-08-16 ENCOUNTER — Encounter (HOSPITAL_COMMUNITY)
Admission: RE | Disposition: A | Discharge status: Home / Self Care | Payer: Self-pay | Source: Home / Self Care | Attending: Internal Medicine

## 2022-08-16 ENCOUNTER — Ambulatory Visit (HOSPITAL_COMMUNITY): Payer: Medicare Other | Admitting: Anesthesiology

## 2022-08-16 ENCOUNTER — Ambulatory Visit (HOSPITAL_COMMUNITY)
Admission: RE | Admit: 2022-08-16 | Discharge: 2022-08-16 | Disposition: A | Discharge status: Home / Self Care | Payer: Medicare Other | Attending: Internal Medicine | Admitting: Internal Medicine

## 2022-08-16 ENCOUNTER — Other Ambulatory Visit: Payer: Self-pay

## 2022-08-16 DIAGNOSIS — K5150 Left sided colitis without complications: Secondary | ICD-10-CM | POA: Insufficient documentation

## 2022-08-16 DIAGNOSIS — K648 Other hemorrhoids: Secondary | ICD-10-CM | POA: Diagnosis not present

## 2022-08-16 DIAGNOSIS — K649 Unspecified hemorrhoids: Secondary | ICD-10-CM | POA: Diagnosis not present

## 2022-08-16 DIAGNOSIS — K5730 Diverticulosis of large intestine without perforation or abscess without bleeding: Secondary | ICD-10-CM | POA: Diagnosis not present

## 2022-08-16 DIAGNOSIS — K529 Noninfective gastroenteritis and colitis, unspecified: Secondary | ICD-10-CM | POA: Diagnosis not present

## 2022-08-16 DIAGNOSIS — Z8711 Personal history of peptic ulcer disease: Secondary | ICD-10-CM | POA: Insufficient documentation

## 2022-08-16 DIAGNOSIS — Z800 Family history of malignant neoplasm of digestive organs: Secondary | ICD-10-CM | POA: Diagnosis not present

## 2022-08-16 HISTORY — PX: BIOPSY: SHX5522

## 2022-08-16 HISTORY — PX: COLONOSCOPY WITH PROPOFOL: SHX5780

## 2022-08-16 SURGERY — COLONOSCOPY WITH PROPOFOL
Anesthesia: General

## 2022-08-16 MED ORDER — PROPOFOL 10 MG/ML IV BOLUS
INTRAVENOUS | Status: DC | PRN
  Administered 2022-08-16: 70 mg via INTRAVENOUS

## 2022-08-16 MED ORDER — LIDOCAINE HCL (CARDIAC) PF 100 MG/5ML IV SOSY
PREFILLED_SYRINGE | INTRAVENOUS | Status: DC | PRN
  Administered 2022-08-16: 50 mg via INTRATRACHEAL

## 2022-08-16 MED ORDER — PROPOFOL 500 MG/50ML IV EMUL
INTRAVENOUS | Status: DC | PRN
  Administered 2022-08-16: 200 ug/kg/min via INTRAVENOUS

## 2022-08-16 MED ORDER — LACTATED RINGERS IV SOLN: INTRAVENOUS | Status: DC

## 2022-08-16 NOTE — Anesthesia Preprocedure Evaluation (Signed)
Anesthesia Evaluation  Patient identified by MRN, date of birth, ID band Patient awake    Reviewed: Allergy & Precautions, H&P , NPO status , Patient's Chart, lab work & pertinent test results, reviewed documented beta blocker date and time   Airway Mallampati: II  TM Distance: >3 FB Neck ROM: full    Dental no notable dental hx.    Pulmonary neg pulmonary ROS   Pulmonary exam normal breath sounds clear to auscultation       Cardiovascular Exercise Tolerance: Good negative cardio ROS  Rhythm:regular Rate:Normal     Neuro/Psych negative neurological ROS  negative psych ROS   GI/Hepatic negative GI ROS, Neg liver ROS, PUD,,,  Endo/Other  negative endocrine ROS    Renal/GU negative Renal ROS  negative genitourinary   Musculoskeletal   Abdominal   Peds  Hematology negative hematology ROS (+)   Anesthesia Other Findings   Reproductive/Obstetrics negative OB ROS                             Anesthesia Physical Anesthesia Plan  ASA: 2  Anesthesia Plan: General   Post-op Pain Management:    Induction:   PONV Risk Score and Plan: Propofol infusion  Airway Management Planned:   Additional Equipment:   Intra-op Plan:   Post-operative Plan:   Informed Consent: I have reviewed the patients History and Physical, chart, labs and discussed the procedure including the risks, benefits and alternatives for the proposed anesthesia with the patient or authorized representative who has indicated his/her understanding and acceptance.     Dental Advisory Given  Plan Discussed with: CRNA  Anesthesia Plan Comments:        Anesthesia Quick Evaluation

## 2022-08-16 NOTE — Op Note (Signed)
[facility] Patient Name: Karen Simpson Procedure Date: 08/16/2022 9:07 AM MRN: [id number] Date of Birth: June 02, 1951 Attending MD: Elon Alas. Abbey Chatters , [place], [id number] CSN: [id number] Age: 72 Admit Type: Outpatient Procedure:                Colonoscopy Indications:              Disease activity assessment of left-sided chronic                            ulcerative colitis Providers:                Elon Alas. Abbey Chatters, DO, Lambert Mody, Aram Candela Referring MD:             Elon Alas. Abbey Chatters, DO Medicines:                See the Anesthesia note for documentation of the                            administered medications Complications:            No immediate complications. Estimated Blood Loss:     Estimated blood loss was minimal. Procedure:                Pre-Anesthesia Assessment:                           - The anesthesia plan was to use monitored                            anesthesia care (MAC).                           After obtaining informed consent, the colonoscope                            was passed under direct vision. Throughout the                            procedure, the patient's blood pressure, pulse, and                            oxygen saturations were monitored continuously. The                            [id number] scope was introduced through                            the anus and advanced to the the cecum, identified                            by appendiceal orifice and ileocecal valve. The                            colonoscopy was performed without difficulty. The  patient tolerated the procedure well. The quality                            of the bowel preparation was evaluated using the                            BBPS [facility] Bowel Preparation Scale) with scores                            of: Right Colon = 3, Transverse Colon = 3 and Left                            Colon = 3 (entire  mucosa seen well with no residual                            staining, small fragments of stool or opaque                            liquid). The total BBPS score equals 9. Scope In: 9:24:05 AM Scope Out: 9:37:30 AM Scope Withdrawal Time: 0 hours 9 minutes 42 seconds  Total Procedure Duration: 0 hours 13 minutes 25 seconds  Findings:      The perianal and digital rectal examinations were normal.      Non-bleeding internal hemorrhoids were found during endoscopy.      A few small-mouthed diverticula were found in the sigmoid colon and       descending colon.      There is no endoscopic evidence of inflammation in the entire colon.      The terminal ileum appeared normal.      Segmental biopsies were taken with a cold forceps in the entire colon       for histology. Impression:               - Non-bleeding internal hemorrhoids.                           - Diverticulosis in the sigmoid colon and in the                            descending colon.                           - The examined portion of the ileum was normal.                           - Biopsies were taken with a cold forceps for                            histology in the entire colon. Moderate Sedation:      Per Anesthesia Care Recommendation:           - Patient has a contact number available for                            emergencies. The signs and symptoms of potential  delayed complications were discussed with the                            patient. Return to normal activities tomorrow.                            Written discharge instructions were provided to the                            patient.                           - Resume previous diet.                           - Continue present medications.                           - Await pathology results.                           - Repeat colonoscopy is recommended for                            surveillance. The colonoscopy date will be                             determined after pathology results from today's                            exam become available for review.                           - Return to GI clinic in 3 months. Procedure Code(s):        --- Professional ---                           [phone], Colonoscopy, flexible; with biopsy, single                            or multiple Diagnosis Code(s):        --- Professional ---                           K64.8, Other hemorrhoids                           K51.50, Left sided colitis without complications                           K57.30, Diverticulosis of large intestine without                            perforation or abscess without bleeding CPT copyright 2022 American Medical Association. All rights reserved. The codes documented in this report are preliminary and upon coder review may  be revised to meet current compliance requirements. Elon Alas. Abbey Chatters, DO  Elon Alas. Abbey Chatters, DO 08/16/2022 9:42:03 AM This report has been signed electronically. Number of Addenda: 0

## 2022-08-16 NOTE — Transfer of Care (Signed)
Immediate Anesthesia Transfer of Care Note  Patient: Karen Simpson  Procedure(s) Performed: COLONOSCOPY WITH PROPOFOL BIOPSY  Patient Location: Endoscopy Unit  Anesthesia Type:General  Level of Consciousness: awake, alert , oriented, and patient cooperative  Airway & Oxygen Therapy: Patient Spontanous Breathing  Post-op Assessment: Report given to RN, Post -op Vital signs reviewed and stable, and Patient moving all extremities  Post vital signs: Reviewed and stable  Last Vitals:  Vitals Value Taken Time  BP    Temp    Pulse    Resp    SpO2      Last Pain:  Vitals:   08/16/22 0924  TempSrc:   PainSc: 0-No pain      Patients Stated Pain Goal: 3 ([id number])  Complications: No notable events documented.

## 2022-08-16 NOTE — H&P (Signed)
Primary Care Physician:  Monico Blitz, MD Primary Gastroenterologist:  Dr. Abbey Chatters  Pre-Procedure History & Physical: HPI:  Karen Simpson is a 72 y.o. female is here for a colonoscopy for history of ulcerative colitis.   Past Medical History:  Diagnosis Date   SCC (squamous cell carcinoma), leg    Ulcerative colitis [facility])     Past Surgical History:  Procedure Laterality Date   BIOPSY  09/07/2020   Procedure: BIOPSY;  Surgeon: Eloise Harman, DO;  [facility];  Service: Endoscopy;;   CHOLECYSTECTOMY     COLONOSCOPY WITH PROPOFOL N/A 09/07/2020   Procedure: COLONOSCOPY WITH PROPOFOL;  Surgeon: Eloise Harman, DO;  [facility];  Service: Endoscopy;  Laterality: N/A;  am appt   EYE SURGERY Bilateral    cataract   TONSILLECTOMY      Prior to Admission medications   Medication Sig Start Date End Date Taking? Authorizing Provider  Ascorbic Acid (VITAMIN C) 1000 MG tablet Take 1,000 mg by mouth daily.   Yes [provider]  aspirin EC 81 MG tablet Take 81 mg by mouth daily. Swallow whole.   Yes [provider]  Calcium Carb-Cholecalciferol (CALCIUM 600 + D PO) Take 2 tablets by mouth daily.   Yes [provider]  cholestyramine (QUESTRAN) 4 g packet Take 0.5-1 packets (2-4 g total) by mouth daily. Patient taking differently: Take 2 g by mouth daily. 05/12/21 08/11/22 Yes Tayna Smethurst K, DO  CRANBERRY PO Take 15,000 mg by mouth daily.   Yes [provider]  ELDERBERRY PO Take 50 mg by mouth daily.   Yes [provider]  gabapentin (NEURONTIN) 300 MG capsule Take 300 mg by mouth daily.   Yes [provider]  hydrocortisone (ANUSOL-HC) 2.5 % rectal cream Place 1 application rectally 2 (two) times daily. For 14 days. May repeat if needed. Do not use long-term. 12/09/20  Yes Mahala Menghini, PA-C  meloxicam (MOBIC) 15 MG tablet Take 15 mg by mouth daily. 02/16/22  Yes [provider]  Multiple  Vitamins-Minerals (CENTRUM SILVER 50+WOMEN PO) Take 1 tablet by mouth daily.   Yes [provider]  Probiotic Product (ALIGN PO) Take 1 capsule by mouth daily.   Yes [provider]  vitamin E 180 MG (400 UNITS) capsule Take 400 Units by mouth daily.   Yes [provider]    Allergies as of 07/19/2022 - Review Complete 07/06/2022  Allergen Reaction Noted   Ciprofloxacin  09/07/2021   Flagyl [metronidazole]  09/07/2021    Family History  Problem Relation Age of Onset   Colon cancer Sister    Stroke Brother     Social History   Socioeconomic History   Marital status: Single    Spouse name: Not on file   Number of children: Not on file   Years of education: Not on file   Highest education level: Not on file  Occupational History   Not on file  Tobacco Use   Smoking status: Never   Smokeless tobacco: Never  Vaping Use   Vaping Use: Never used  Substance and Sexual Activity   Alcohol use: Not Currently   Drug use: Never   Sexual activity: Not Currently  Other Topics Concern   Not on file  Social History Narrative   Not on file   Social Determinants of Health   Financial Resource Strain: Not on file  Food Insecurity: Not on file  Transportation Needs: Not on file  Physical Activity: Not on file  Stress: Not on file  Social Connections: Not on file  Intimate Partner Violence: Not on file    Review of Systems: See HPI, otherwise negative ROS  Physical Exam: Vital signs in last 24 hours: Temp:  [97.7 F (36.5 C)] 97.7 F (36.5 C) (03/05 0742) Pulse Rate:  [74] 74 (03/05 0742) BP: (128)/(64) 128/64 (03/05 0742) SpO2:  [98 %] 98 % (03/05 0742) Weight:  [70.3 kg] 70.3 kg (03/05 0742)   General:   Alert,  Well-developed, well-nourished, pleasant and cooperative in NAD Head:  Normocephalic and atraumatic. Eyes:  Sclera clear, no icterus.   Conjunctiva pink. Ears:  Normal auditory acuity. Nose:  No deformity, discharge,  or  lesions. Msk:  Symmetrical without gross deformities. Normal posture. Extremities:  Without clubbing or edema. Neurologic:  Alert and  oriented x4;  grossly normal neurologically. Skin:  Intact without significant lesions or rashes. Psych:  Alert and cooperative. Normal mood and affect.  Impression/Plan: Karen Simpson is here for a colonoscopy to be performed for colon history of ulcerative colitis.   The risks of the procedure including infection, bleed, or perforation as well as benefits, limitations, alternatives and imponderables have been reviewed with the patient. Questions have been answered. All parties agreeable.

## 2022-08-16 NOTE — Discharge Instructions (Addendum)
  Colonoscopy Discharge Instructions  Read the instructions outlined below and refer to this sheet in the next few weeks. These discharge instructions provide you with general information on caring for yourself after you leave the hospital. Your doctor may also give you specific instructions. While your treatment has been planned according to the most current medical practices available, unavoidable complications occasionally occur.   ACTIVITY You may resume your regular activity, but move at a slower pace for the next 24 hours.  Take frequent rest periods for the next 24 hours.  Walking will help get rid of the air and reduce the bloated feeling in your belly (abdomen).  No driving for 24 hours (because of the medicine (anesthesia) used during the test).   Do not sign any important legal documents or operate any machinery for 24 hours (because of the anesthesia used during the test).  NUTRITION Drink plenty of fluids.  You may resume your normal diet as instructed by your doctor.  Begin with a light meal and progress to your normal diet. Heavy or fried foods are harder to digest and may make you feel sick to your stomach (nauseated).  Avoid alcoholic beverages for 24 hours or as instructed.  MEDICATIONS You may resume your normal medications unless your doctor tells you otherwise.  WHAT YOU CAN EXPECT TODAY Some feelings of bloating in the abdomen.  Passage of more gas than usual.  Spotting of blood in your stool or on the toilet paper.  IF YOU HAD POLYPS REMOVED DURING THE COLONOSCOPY: No aspirin products for 7 days or as instructed.  No alcohol for 7 days or as instructed.  Eat a soft diet for the next 24 hours.  FINDING OUT THE RESULTS OF YOUR TEST Not all test results are available during your visit. If your test results are not back during the visit, make an appointment with your caregiver to find out the results. Do not assume everything is normal if you have not heard from your  caregiver or the medical facility. It is important for you to follow up on all of your test results.  SEEK IMMEDIATE MEDICAL ATTENTION IF: You have more than a spotting of blood in your stool.  Your belly is swollen (abdominal distention).  You are nauseated or vomiting.  You have a temperature over 101.  You have abdominal pain or discomfort that is severe or gets worse throughout the day.   Overall, your colon looked very healthy.  I did not see any active inflammation throughout your entire colon.  I did take biopsies throughout your entire colon.  Await pathology results, office will contact you.  No polyps or evidence of colon cancer.  You do have diverticulosis and internal hemorrhoids. I would recommend increasing fiber in your diet or adding OTC Benefiber/Metamucil. Be sure to drink at least 4 to 6 glasses of water daily. Follow-up with GI in 3-4 months OFFICE TO CALL WITH APPOINTMENT   I hope you have a great rest of your week!  Elon Alas. Abbey Chatters, D.O. Gastroenterology and Hepatology [facility]

## 2022-08-17 NOTE — Anesthesia Postprocedure Evaluation (Signed)
Anesthesia Post Note  Patient: Karen Simpson  Procedure(s) Performed: COLONOSCOPY WITH PROPOFOL BIOPSY  Patient location during evaluation: Phase II Anesthesia Type: General Level of consciousness: awake Pain management: pain level controlled Vital Signs Assessment: post-procedure vital signs reviewed and stable Respiratory status: spontaneous breathing and respiratory function stable Cardiovascular status: blood pressure returned to baseline and stable Postop Assessment: no headache and no apparent nausea or vomiting Anesthetic complications: no Comments: Late entry   No notable events documented.   Last Vitals:  Vitals:   08/16/22 0742 08/16/22 0941  BP: 128/64 (!) 95/42  Pulse: 74 71  Resp:  15  Temp: 36.5 C (!) 36.4 C  SpO2: 98% 100%    Last Pain:  Vitals:   08/16/22 0941  TempSrc: Oral  PainSc: 0-No pain                 Louann Sjogren

## 2022-08-23 LAB — SURGICAL PATHOLOGY

## 2022-08-26 DIAGNOSIS — J019 Acute sinusitis, unspecified: Secondary | ICD-10-CM | POA: Diagnosis not present

## 2022-08-26 DIAGNOSIS — R42 Dizziness and giddiness: Secondary | ICD-10-CM | POA: Diagnosis not present

## 2022-08-29 ENCOUNTER — Encounter (HOSPITAL_COMMUNITY): Payer: Self-pay | Admitting: Internal Medicine

## 2022-08-29 NOTE — Addendum Note (Signed)
Addendum  created 08/29/22 0711 by Myna Bright, CRNA   Intraprocedure Staff edited

## 2022-09-13 DIAGNOSIS — H903 Sensorineural hearing loss, bilateral: Secondary | ICD-10-CM | POA: Diagnosis not present

## 2022-09-13 DIAGNOSIS — R42 Dizziness and giddiness: Secondary | ICD-10-CM | POA: Diagnosis not present

## 2022-09-20 ENCOUNTER — Ambulatory Visit (INDEPENDENT_AMBULATORY_CARE_PROVIDER_SITE_OTHER): Payer: Medicare Other | Admitting: Gastroenterology

## 2022-09-20 ENCOUNTER — Encounter: Payer: Self-pay | Admitting: Gastroenterology

## 2022-09-20 VITALS — BP 109/72 | HR 60 | Temp 97.8°F | Ht 62.0 in | Wt 159.2 lb

## 2022-09-20 DIAGNOSIS — K5190 Ulcerative colitis, unspecified, without complications: Secondary | ICD-10-CM

## 2022-09-20 NOTE — Progress Notes (Unsigned)
GI Office Note    Referring Provider: Kirstie Peri, MD Primary Care Physician:  Kirstie Peri, MD  Primary Gastroenterologist: Hennie Duos. Marletta Lor, DO   Chief Complaint   Chief Complaint  Patient presents with   Follow-up    Patient here today for a follow up visit . Patient denies any current gi issues. Patient says UC is doing well on Cholestyramine which she takes one packet daily.    History of Present Illness   Karen Simpson is a 72 y.o. female presenting today for follow-up.  Last seen in January.  He has a history of ulcerative colitis.   Previously followed by Dr.Pandya.  Previously on sulfasalazine in the past.  Colonoscopy January 2018 showed nonspecific chronic inflammation of the cecum and rectum on pathology.  Endoscopically she had erythema/edema/microulcerations more in the proximal two thirds of the colon.  Treated with mesalamine. She reports sister had colon cancer.   Colonoscopy here in March 2022 showed internal hemorrhoids, diverticulosis of the sigmoid and descending colon, otherwise colon unremarkable.  Random colon biopsy showed focal active colitis.  Plans for repeat colonoscopy in March 2024.  Last visit she was having diarrhea.  GI pathogen panel was positive for norovirus.  The lab since has put out information that there were false positive norovirus on this panel.  At the time it was felt the patient's had likely going on too long to be explain the mild norovirus.  We started her on half a packet of cholestyramine daily, added probiotic.  Symptoms started to improve.  We made arrangements for surveillance colonoscopy.  Colonoscopy completed August 16, 2022.  She had nonbleeding internal hemorrhoids.  Diverticulosis.  No endoscopic evidence of inflammation in the entire colon.  Normal terminal ileum.  Colon biopsy showed mild inflammation, some of the chronic features more consistent with Crohn's colitis and not ulcerative colitis.  Evidence of granulomas  raise possibility of Crohn's.  Special stains negative for acid-fast bacilli or fungi.   Today: BM most days. No melena, brbpr. No abdominal pain. No gerd, dysphagia, vomiting.   Taking questran 4 grams daily.  Medications   Current Outpatient Medications  Medication Sig Dispense Refill   Ascorbic Acid (VITAMIN C) 1000 MG tablet Take 1,000 mg by mouth daily.     aspirin EC 81 MG tablet Take 81 mg by mouth daily. Swallow whole.     Calcium Carb-Cholecalciferol (CALCIUM 600 + D PO) Take 2 tablets by mouth daily.     cholestyramine (QUESTRAN) 4 g packet Take 0.5-1 packets (2-4 g total) by mouth daily. (Patient taking differently: Take 4 g by mouth daily.) 30 packet 5   CRANBERRY PO Take 15,000 mg by mouth daily.     ELDERBERRY PO Take 50 mg by mouth daily.     gabapentin (NEURONTIN) 300 MG capsule Take 300 mg by mouth daily.     hydrocortisone (ANUSOL-HC) 2.5 % rectal cream Place 1 application rectally 2 (two) times daily. For 14 days. May repeat if needed. Do not use long-term. 30 g 1   meloxicam (MOBIC) 15 MG tablet Take 15 mg by mouth daily.     Multiple Vitamins-Minerals (CENTRUM SILVER 50+WOMEN PO) Take 1 tablet by mouth daily.     Omega-3 Fatty Acids (FISH OIL PO) Take 1 capsule by mouth daily.     Probiotic Product (ALIGN PO) Take 1 capsule by mouth daily.     vitamin E 180 MG (400 UNITS) capsule Take 400 Units by mouth daily.  No current facility-administered medications for this visit.    Allergies   Allergies as of 09/20/2022 - Review Complete 09/20/2022  Allergen Reaction Noted   Ciprofloxacin  09/07/2021   Flagyl [metronidazole]  09/07/2021     Past Medical History   Past Medical History:  Diagnosis Date   SCC (squamous cell carcinoma), leg    Ulcerative colitis     Past Surgical History   Past Surgical History:  Procedure Laterality Date   BIOPSY  09/07/2020   Procedure: BIOPSY;  Surgeon: Lanelle Bal, DO;  [facility];  Service:  Endoscopy;;   BIOPSY  08/16/2022   Procedure: BIOPSY;  Surgeon: Lanelle Bal, DO;  [facility];  Service: Endoscopy;;   CHOLECYSTECTOMY     COLONOSCOPY WITH PROPOFOL N/A 09/07/2020   Procedure: COLONOSCOPY WITH PROPOFOL;  Surgeon: Lanelle Bal, DO;  [facility];  Service: Endoscopy;  Laterality: N/A;  am appt   COLONOSCOPY WITH PROPOFOL N/A 08/16/2022   Procedure: COLONOSCOPY WITH PROPOFOL;  Surgeon: Lanelle Bal, DO;  [facility];  Service: Endoscopy;  Laterality: N/A;  9:00am, asa 2   EYE SURGERY Bilateral    cataract   TONSILLECTOMY      Past Family History   Family History  Problem Relation Age of Onset   Colon cancer Sister    Stroke Brother     Past Social History   Social History   Socioeconomic History   Marital status: Single    Spouse name: Not on file   Number of children: Not on file   Years of education: Not on file   Highest education level: Not on file  Occupational History   Not on file  Tobacco Use   Smoking status: Never   Smokeless tobacco: Never  Vaping Use   Vaping Use: Never used  Substance and Sexual Activity   Alcohol use: Not Currently   Drug use: Never   Sexual activity: Not Currently  Other Topics Concern   Not on file  Social History Narrative   Not on file   Social Determinants of Health   Financial Resource Strain: Not on file  Food Insecurity: Not on file  Transportation Needs: Not on file  Physical Activity: Not on file  Stress: Not on file  Social Connections: Not on file  Intimate Partner Violence: Not on file    Review of Systems   General: Negative for anorexia, weight loss, fever, chills, fatigue, weakness. ENT: Negative for hoarseness, difficulty swallowing , nasal congestion. CV: Negative for chest pain, angina, palpitations, dyspnea on exertion, peripheral edema.  Respiratory: Negative for dyspnea at rest, dyspnea on exertion, cough, sputum, wheezing.  GI: See history  of present illness. GU:  Negative for dysuria, hematuria, urinary incontinence, urinary frequency, nocturnal urination.  Endo: Negative for unusual weight change.     Physical Exam   BP 109/72 (BP Location: Right Arm, Patient Position: Sitting, Cuff Size: Large)   Pulse 60   Temp 97.8 F (36.6 C) (Temporal)   Ht 5\' 2"  (1.575 m)   Wt 159 lb 3.2 oz (72.2 kg)   BMI 29.12 kg/m    General: Well-nourished, well-developed in no acute distress.  Eyes: No icterus. Mouth: Oropharyngeal mucosa moist and pink , no lesions erythema or exudate. Lungs: Clear to auscultation bilaterally.  Heart: Regular rate and rhythm, no murmurs rubs or gallops.  Abdomen: Bowel sounds are normal, nontender, nondistended, no hepatosplenomegaly or masses,  no abdominal bruits  or hernia , no rebound or guarding.  Rectal: ***  Extremities: No lower extremity edema. No clubbing or deformities. Neuro: Alert and oriented x 4   Skin: Warm and dry, no jaundice.   Psych: Alert and cooperative, normal mood and affect.  Labs   *** Imaging Studies   No results found.  Assessment       PLAN   ***   Laureen Ochs. Bobby Rumpf, [place], [facility]

## 2022-09-20 NOTE — Patient Instructions (Signed)
I will be in touch once I have discussed your pathology results with Dr. Marletta Lor.   Please call if you have recurrent diarrhea, blood in stool, or abdominal pain.  Return office visit in six months to see Dr. Marletta Lor.

## 2022-10-06 DIAGNOSIS — J019 Acute sinusitis, unspecified: Secondary | ICD-10-CM | POA: Diagnosis not present

## 2022-10-06 DIAGNOSIS — H8110 Benign paroxysmal vertigo, unspecified ear: Secondary | ICD-10-CM | POA: Diagnosis not present

## 2022-10-10 DIAGNOSIS — R059 Cough, unspecified: Secondary | ICD-10-CM | POA: Diagnosis not present

## 2022-10-10 DIAGNOSIS — J069 Acute upper respiratory infection, unspecified: Secondary | ICD-10-CM | POA: Diagnosis not present

## 2022-10-10 DIAGNOSIS — J019 Acute sinusitis, unspecified: Secondary | ICD-10-CM | POA: Diagnosis not present

## 2022-10-19 DIAGNOSIS — Z08 Encounter for follow-up examination after completed treatment for malignant neoplasm: Secondary | ICD-10-CM | POA: Diagnosis not present

## 2022-10-19 DIAGNOSIS — R229 Localized swelling, mass and lump, unspecified: Secondary | ICD-10-CM | POA: Diagnosis not present

## 2022-10-19 DIAGNOSIS — C44329 Squamous cell carcinoma of skin of other parts of face: Secondary | ICD-10-CM | POA: Diagnosis not present

## 2022-10-19 DIAGNOSIS — C44629 Squamous cell carcinoma of skin of left upper limb, including shoulder: Secondary | ICD-10-CM | POA: Diagnosis not present

## 2022-10-19 DIAGNOSIS — L821 Other seborrheic keratosis: Secondary | ICD-10-CM | POA: Diagnosis not present

## 2022-10-19 DIAGNOSIS — D485 Neoplasm of uncertain behavior of skin: Secondary | ICD-10-CM | POA: Diagnosis not present

## 2022-10-19 DIAGNOSIS — Z85828 Personal history of other malignant neoplasm of skin: Secondary | ICD-10-CM | POA: Diagnosis not present

## 2022-10-24 DIAGNOSIS — J069 Acute upper respiratory infection, unspecified: Secondary | ICD-10-CM | POA: Diagnosis not present

## 2022-10-24 DIAGNOSIS — R053 Chronic cough: Secondary | ICD-10-CM | POA: Diagnosis not present

## 2022-10-25 DIAGNOSIS — R053 Chronic cough: Secondary | ICD-10-CM | POA: Diagnosis not present

## 2022-10-26 DIAGNOSIS — Z299 Encounter for prophylactic measures, unspecified: Secondary | ICD-10-CM | POA: Diagnosis not present

## 2022-10-26 DIAGNOSIS — M542 Cervicalgia: Secondary | ICD-10-CM | POA: Diagnosis not present

## 2022-10-26 DIAGNOSIS — J069 Acute upper respiratory infection, unspecified: Secondary | ICD-10-CM | POA: Diagnosis not present

## 2022-11-11 ENCOUNTER — Other Ambulatory Visit: Payer: Self-pay | Admitting: Internal Medicine

## 2022-11-11 DIAGNOSIS — Z1231 Encounter for screening mammogram for malignant neoplasm of breast: Secondary | ICD-10-CM

## 2022-11-17 ENCOUNTER — Telehealth: Payer: Self-pay | Admitting: Gastroenterology

## 2022-11-17 NOTE — Telephone Encounter (Signed)
Pt was made aware and verbalized understanding.  

## 2022-11-17 NOTE — Telephone Encounter (Signed)
Please let pt know that Dr. Marletta Lor and I apologize in the delay in getting back in touch with her. At this time he is recommending continuing with Questran as opposed to taking any medication for ulcerative colitis or crohn's (she has history of UC but most recent path questioned Crohn's) since she has been doing well clinically.   He wants her to make sure she sees him when she comes back for follow up (03/22/23) so they can discuss future management.   If she has recurrent diarrhea, abd pain, or blood in the stool, she should call sooner.

## 2022-11-28 DIAGNOSIS — D0462 Carcinoma in situ of skin of left upper limb, including shoulder: Secondary | ICD-10-CM | POA: Diagnosis not present

## 2022-12-01 ENCOUNTER — Ambulatory Visit
Admission: RE | Admit: 2022-12-01 | Discharge: 2022-12-01 | Disposition: A | Discharge status: Home / Self Care | Payer: Medicare Other | Source: Ambulatory Visit | Attending: Internal Medicine | Admitting: Internal Medicine

## 2022-12-01 DIAGNOSIS — Z1231 Encounter for screening mammogram for malignant neoplasm of breast: Secondary | ICD-10-CM

## 2022-12-23 DIAGNOSIS — K5190 Ulcerative colitis, unspecified, without complications: Secondary | ICD-10-CM | POA: Diagnosis not present

## 2022-12-23 DIAGNOSIS — M542 Cervicalgia: Secondary | ICD-10-CM | POA: Diagnosis not present

## 2022-12-23 DIAGNOSIS — L309 Dermatitis, unspecified: Secondary | ICD-10-CM | POA: Diagnosis not present

## 2022-12-23 DIAGNOSIS — Z299 Encounter for prophylactic measures, unspecified: Secondary | ICD-10-CM | POA: Diagnosis not present

## 2023-01-23 DIAGNOSIS — M542 Cervicalgia: Secondary | ICD-10-CM | POA: Diagnosis not present

## 2023-01-23 DIAGNOSIS — Z299 Encounter for prophylactic measures, unspecified: Secondary | ICD-10-CM | POA: Diagnosis not present

## 2023-01-23 DIAGNOSIS — Z6829 Body mass index (BMI) 29.0-29.9, adult: Secondary | ICD-10-CM | POA: Diagnosis not present

## 2023-03-13 DIAGNOSIS — R0981 Nasal congestion: Secondary | ICD-10-CM | POA: Diagnosis not present

## 2023-03-13 DIAGNOSIS — U071 COVID-19: Secondary | ICD-10-CM | POA: Diagnosis not present

## 2023-03-22 ENCOUNTER — Ambulatory Visit: Payer: Medicare Other | Admitting: Internal Medicine

## 2023-03-22 DIAGNOSIS — Z23 Encounter for immunization: Secondary | ICD-10-CM | POA: Diagnosis not present

## 2023-03-22 DIAGNOSIS — Z299 Encounter for prophylactic measures, unspecified: Secondary | ICD-10-CM | POA: Diagnosis not present

## 2023-03-22 DIAGNOSIS — R059 Cough, unspecified: Secondary | ICD-10-CM | POA: Diagnosis not present

## 2023-03-22 DIAGNOSIS — R0989 Other specified symptoms and signs involving the circulatory and respiratory systems: Secondary | ICD-10-CM | POA: Diagnosis not present

## 2023-03-22 DIAGNOSIS — J069 Acute upper respiratory infection, unspecified: Secondary | ICD-10-CM | POA: Diagnosis not present

## 2023-04-05 ENCOUNTER — Ambulatory Visit (INDEPENDENT_AMBULATORY_CARE_PROVIDER_SITE_OTHER): Payer: Medicare Other | Admitting: Internal Medicine

## 2023-04-05 ENCOUNTER — Encounter: Payer: Self-pay | Admitting: Internal Medicine

## 2023-04-05 VITALS — BP 129/73 | HR 84 | Temp 97.8°F | Ht 62.0 in | Wt 166.9 lb

## 2023-04-05 DIAGNOSIS — Z8719 Personal history of other diseases of the digestive system: Secondary | ICD-10-CM | POA: Diagnosis not present

## 2023-04-05 DIAGNOSIS — K5010 Crohn's disease of large intestine without complications: Secondary | ICD-10-CM

## 2023-04-05 DIAGNOSIS — K6389 Other specified diseases of intestine: Secondary | ICD-10-CM | POA: Diagnosis not present

## 2023-04-05 DIAGNOSIS — K529 Noninfective gastroenteritis and colitis, unspecified: Secondary | ICD-10-CM | POA: Diagnosis not present

## 2023-04-05 MED ORDER — CHOLESTYRAMINE 4 G PO PACK: 4.0000 g | PACK | Freq: Every day | ORAL | 11 refills | Status: DC

## 2023-04-05 NOTE — Patient Instructions (Signed)
I am happy to hear that you are doing so well today.  I am going to check blood work at [facility] today including CBC, CMP, vitamin B12, vitamin D, screen you for sarcoidosis, as well as check IBD panel.  We will call with results.  Continue on Questran.  I refilled this today.  Follow-up in 3 to 4 months.  It was very nice seeing you again today.  Dr. Marletta Lor

## 2023-04-05 NOTE — Progress Notes (Signed)
Primary Care Physician:  Kirstie Peri, MD Primary Gastroenterologist:  Dr. Marletta Lor  Chief Complaint  Patient presents with   Follow-up    Patient here today for a follow up on her UC. Patient denies any current issues. She is taking Questran 4g packet daily.    HPI:   Karen Simpson is a 72 y.o. female who presents to clinic today for follow-up visit for IBD.  Originally diagnosed with UC. This was previously followed by Dr. Samuella Cota.  Was on sulfasalazine in the past though this was discontinued.    Colonoscopy 06/2016: hemorrhoids, inflammation (erythema, edema, micro-ulcerations, more in the proximal 2/3 of the colon. Path "nonspecific chronic inflammation of cecum and rectum".    Per OV note: patient treated with welchol, questran. Symptoms improved on mesalamine. "Advised to stay on Azulfidine 2gm/day with folic acid".   Reports her sister had colon cancer in the past.  Colonoscopy March 2022 which showed internal hemorrhoids, diverticulosis of the sigmoid descending colon, colon mucosa otherwise unremarkable.  Colon biopsies did show focal active colitis.  Patient was doing well until onset of diarrhea January 2024.  GI pathogen panel positive for norovirus.  Colonoscopy August 16, 2022. She had nonbleeding internal hemorrhoids. Diverticulosis. No endoscopic evidence of inflammation in the entire colon. Normal terminal ileum. Segmental colon biopsies showed mild inflammation of sigmoid colon, some of the chronic features more consistent with Crohn's colitis and not ulcerative colitis. Evidence of granulomas raise possibility of Crohn's. Special stains negative for acid-fast bacilli or fungi.   Today, states she is doing great.  Having normal bowel movements.  No melena hematochezia.  No mucus in her stool.  No abdominal pain.  Continues to take Questran 4 g daily.    Past Medical History:  Diagnosis Date   SCC (squamous cell carcinoma), leg    Ulcerative colitis [facility])     Past  Surgical History:  Procedure Laterality Date   BIOPSY  09/07/2020   Procedure: BIOPSY;  Surgeon: Lanelle Bal, DO;  [facility];  Service: Endoscopy;;   BIOPSY  08/16/2022   Procedure: BIOPSY;  Surgeon: Lanelle Bal, DO;  [facility];  Service: Endoscopy;;   BREAST EXCISIONAL BIOPSY Left    BREAST EXCISIONAL BIOPSY Left    CHOLECYSTECTOMY     COLONOSCOPY WITH PROPOFOL N/A 09/07/2020   Procedure: COLONOSCOPY WITH PROPOFOL;  Surgeon: Lanelle Bal, DO;  [facility];  Service: Endoscopy;  Laterality: N/A;  am appt   COLONOSCOPY WITH PROPOFOL N/A 08/16/2022   Procedure: COLONOSCOPY WITH PROPOFOL;  Surgeon: Lanelle Bal, DO;  [facility];  Service: Endoscopy;  Laterality: N/A;  9:00am, asa 2   EYE SURGERY Bilateral    cataract   TONSILLECTOMY      Current Outpatient Medications  Medication Sig Dispense Refill   Ascorbic Acid (VITAMIN C) 1000 MG tablet Take 1,000 mg by mouth daily.     aspirin EC 81 MG tablet Take 81 mg by mouth daily. Swallow whole.     Calcium Carb-Cholecalciferol (CALCIUM 600 + D PO) Take 2 tablets by mouth daily.     cholestyramine (QUESTRAN) 4 g packet Take 0.5-1 packets (2-4 g total) by mouth daily. (Patient taking differently: Take 4 g by mouth daily.) 30 packet 5   CRANBERRY PO Take 15,000 mg by mouth daily.     ELDERBERRY PO Take 50 mg by mouth daily.     gabapentin (NEURONTIN) 300 MG capsule Take 300  mg by mouth daily.     hydrocortisone (ANUSOL-HC) 2.5 % rectal cream Place 1 application rectally 2 (two) times daily. For 14 days. May repeat if needed. Do not use long-term. 30 g 1   meloxicam (MOBIC) 15 MG tablet Take 15 mg by mouth daily.     Multiple Vitamins-Minerals (CENTRUM SILVER 50+WOMEN PO) Take 1 tablet by mouth daily.     Probiotic Product (ALIGN PO) Take 1 capsule by mouth daily.     vitamin E 180 MG (400 UNITS) capsule Take 400 Units by mouth daily.     No current facility-administered  medications for this visit.    Allergies as of 04/05/2023 - Review Complete 04/05/2023  Allergen Reaction Noted   Ciprofloxacin  09/07/2021   Flagyl [metronidazole]  09/07/2021    Family History  Problem Relation Age of Onset   Colon cancer Sister    Stroke Brother     Social History   Socioeconomic History   Marital status: Single    Spouse name: Not on file   Number of children: Not on file   Years of education: Not on file   Highest education level: Not on file  Occupational History   Not on file  Tobacco Use   Smoking status: Never   Smokeless tobacco: Never  Vaping Use   Vaping status: Never Used  Substance and Sexual Activity   Alcohol use: Not Currently   Drug use: Never   Sexual activity: Not Currently  Other Topics Concern   Not on file  Social History Narrative   Not on file   Social Determinants of Health   Financial Resource Strain: Not on file  Food Insecurity: Not on file  Transportation Needs: Not on file  Physical Activity: Not on file  Stress: Not on file  Social Connections: Not on file  Intimate Partner Violence: Not on file    Subjective: Review of Systems  Constitutional:  Negative for chills and fever.  HENT:  Negative for congestion and hearing loss.   Eyes:  Negative for blurred vision and double vision.  Respiratory:  Negative for cough and shortness of breath.   Cardiovascular:  Negative for chest pain and palpitations.  Gastrointestinal:  Negative for abdominal pain, blood in stool, constipation, diarrhea, heartburn, melena and vomiting.  Genitourinary:  Negative for dysuria and urgency.  Musculoskeletal:  Negative for joint pain and myalgias.  Skin:  Negative for itching and rash.  Neurological:  Negative for dizziness and headaches.  Psychiatric/Behavioral:  Negative for depression. The patient is not nervous/anxious.        Objective: BP 129/73 (BP Location: Left Arm, Patient Position: Sitting, Cuff Size: Normal)    Pulse 84   Temp 97.8 F (36.6 C) (Temporal)   Ht 5\' 2"  (1.575 m)   Wt 166 lb 14.4 oz (75.7 kg)   BMI 30.53 kg/m  Physical Exam Constitutional:      Appearance: Normal appearance.  HENT:     Head: Normocephalic and atraumatic.  Eyes:     Extraocular Movements: Extraocular movements intact.     Conjunctiva/sclera: Conjunctivae normal.  Cardiovascular:     Rate and Rhythm: Normal rate and regular rhythm.  Pulmonary:     Effort: Pulmonary effort is normal.     Breath sounds: Normal breath sounds.  Abdominal:     General: Bowel sounds are normal.     Palpations: Abdomen is soft.  Musculoskeletal:        General: No swelling. Normal range of motion.  Cervical back: Normal range of motion and neck supple.  Skin:    General: Skin is warm and dry.     Coloration: Skin is not jaundiced.  Neurological:     General: No focal deficit present.     Mental Status: She is alert and oriented to person, place, and time.  Psychiatric:        Mood and Affect: Mood normal.        Behavior: Behavior normal.      Assessment: *Chronic colitis  Plan: Discussed patient's colonoscopy and path results in depth with her today.  Her biopsies raise the possibility of questionable Crohn's colitis.  Fortunately she appears to be in clinical remission.  Mild amount of inflammation noted in her sigmoid colon, otherwise chronic findings.  Will check blood work today including CBC, CMP, B12, vitamin D, ESR/CRP.  Given presence of granulomas will check ACE level.  Historically diagnosed with UC now with questionable findings of Crohn's colitis.  Will check IBD panel.  Continue on Questran daily.  Follow-up in 3 to 4 months.   04/05/2023 10:41 AM   Disclaimer: This note was dictated with voice recognition software. Similar sounding words can inadvertently be transcribed and may not be corrected upon review.

## 2023-04-06 LAB — INFLAMMATORY BOWEL DISEASE-IBD
Atypical pANCA: <1:20 {titer}
Saccharomyces cerevisiae, IgA: <20 U (ref 0.0–24.9)
Saccharomyces cerevisiae, IgG: <20 U (ref 0.0–24.9)

## 2023-04-06 LAB — COMPREHENSIVE METABOLIC PANEL
ALT: 27 [IU]/L (ref 0–32)
AST: 24 [IU]/L (ref 0–40)
Albumin: 4.3 g/dL (ref 3.8–4.8)
Alkaline Phosphatase: 87 [IU]/L (ref 44–121)
BUN/Creatinine Ratio: 30 — ABNORMAL HIGH (ref 12–28)
BUN: 20 mg/dL (ref 8–27)
Bilirubin Total: <0.2 mg/dL (ref 0.0–1.2)
CO2: 24 mmol/L (ref 20–29)
Calcium: 9.7 mg/dL (ref 8.7–10.3)
Chloride: 104 mmol/L (ref 96–106)
Creatinine, Ser: 0.66 mg/dL (ref 0.57–1.00)
Globulin, Total: 2.4 g/dL (ref 1.5–4.5)
Glucose: 85 mg/dL (ref 70–99)
Potassium: 4.4 mmol/L (ref 3.5–5.2)
Sodium: 144 mmol/L (ref 134–144)
Total Protein: 6.7 g/dL (ref 6.0–8.5)
eGFR: 92 mL/min/{1.73_m2} (ref >59)

## 2023-04-06 LAB — CBC
Hematocrit: 40 % (ref 34.0–46.6)
Hemoglobin: 12.6 g/dL (ref 11.1–15.9)
MCH: 28.9 pg (ref 26.6–33.0)
MCHC: 31.5 g/dL (ref 31.5–35.7)
MCV: 92 fL (ref 79–97)
Platelets: 258 10*3/uL (ref 150–450)
RBC: 4.36 x10E6/uL (ref 3.77–5.28)
RDW: 13.3 % (ref 11.7–15.4)
WBC: 8.8 10*3/uL (ref 3.4–10.8)

## 2023-04-06 LAB — ANGIOTENSIN CONVERTING ENZYME: Angio Convert Enzyme: 26 U/L (ref 14–82)

## 2023-04-06 LAB — B12 AND FOLATE PANEL
Folate: >20 ng/mL (ref >3.0)
Vitamin B-12: 366 pg/mL (ref 232–1245)

## 2023-04-06 LAB — VITAMIN D 25 HYDROXY (VIT D DEFICIENCY, FRACTURES): Vit D, 25-Hydroxy: 41.4 ng/mL (ref 30.0–100.0)

## 2023-04-19 DIAGNOSIS — N281 Cyst of kidney, acquired: Secondary | ICD-10-CM | POA: Diagnosis not present

## 2023-04-25 DIAGNOSIS — K5190 Ulcerative colitis, unspecified, without complications: Secondary | ICD-10-CM | POA: Diagnosis not present

## 2023-04-25 DIAGNOSIS — R202 Paresthesia of skin: Secondary | ICD-10-CM | POA: Diagnosis not present

## 2023-04-25 DIAGNOSIS — Z299 Encounter for prophylactic measures, unspecified: Secondary | ICD-10-CM | POA: Diagnosis not present

## 2023-04-25 DIAGNOSIS — M542 Cervicalgia: Secondary | ICD-10-CM | POA: Diagnosis not present

## 2023-05-02 DIAGNOSIS — C44629 Squamous cell carcinoma of skin of left upper limb, including shoulder: Secondary | ICD-10-CM | POA: Diagnosis not present

## 2023-05-02 DIAGNOSIS — Z08 Encounter for follow-up examination after completed treatment for malignant neoplasm: Secondary | ICD-10-CM | POA: Diagnosis not present

## 2023-05-02 DIAGNOSIS — Z85828 Personal history of other malignant neoplasm of skin: Secondary | ICD-10-CM | POA: Diagnosis not present

## 2023-05-02 DIAGNOSIS — L570 Actinic keratosis: Secondary | ICD-10-CM | POA: Diagnosis not present

## 2023-05-02 DIAGNOSIS — L923 Foreign body granuloma of the skin and subcutaneous tissue: Secondary | ICD-10-CM | POA: Diagnosis not present

## 2023-05-02 DIAGNOSIS — L728 Other follicular cysts of the skin and subcutaneous tissue: Secondary | ICD-10-CM | POA: Diagnosis not present

## 2023-05-02 DIAGNOSIS — R229 Localized swelling, mass and lump, unspecified: Secondary | ICD-10-CM | POA: Diagnosis not present

## 2023-05-03 DIAGNOSIS — H26492 Other secondary cataract, left eye: Secondary | ICD-10-CM | POA: Diagnosis not present

## 2023-05-03 DIAGNOSIS — H35373 Puckering of macula, bilateral: Secondary | ICD-10-CM | POA: Diagnosis not present

## 2023-05-03 DIAGNOSIS — H04123 Dry eye syndrome of bilateral lacrimal glands: Secondary | ICD-10-CM | POA: Diagnosis not present

## 2023-05-12 DIAGNOSIS — R0981 Nasal congestion: Secondary | ICD-10-CM | POA: Diagnosis not present

## 2023-05-12 DIAGNOSIS — K5190 Ulcerative colitis, unspecified, without complications: Secondary | ICD-10-CM | POA: Diagnosis not present

## 2023-05-12 DIAGNOSIS — J029 Acute pharyngitis, unspecified: Secondary | ICD-10-CM | POA: Diagnosis not present

## 2023-05-12 DIAGNOSIS — R52 Pain, unspecified: Secondary | ICD-10-CM | POA: Diagnosis not present

## 2023-06-23 ENCOUNTER — Encounter: Payer: Self-pay | Admitting: Internal Medicine

## 2023-07-07 DIAGNOSIS — Z0000 Encounter for general adult medical examination without abnormal findings: Secondary | ICD-10-CM | POA: Diagnosis not present

## 2023-07-07 DIAGNOSIS — Z79899 Other long term (current) drug therapy: Secondary | ICD-10-CM | POA: Diagnosis not present

## 2023-07-07 DIAGNOSIS — E559 Vitamin D deficiency, unspecified: Secondary | ICD-10-CM | POA: Diagnosis not present

## 2023-07-07 DIAGNOSIS — Z1339 Encounter for screening examination for other mental health and behavioral disorders: Secondary | ICD-10-CM | POA: Diagnosis not present

## 2023-07-07 DIAGNOSIS — Z1331 Encounter for screening for depression: Secondary | ICD-10-CM | POA: Diagnosis not present

## 2023-07-07 DIAGNOSIS — Z299 Encounter for prophylactic measures, unspecified: Secondary | ICD-10-CM | POA: Diagnosis not present

## 2023-07-07 DIAGNOSIS — Z7189 Other specified counseling: Secondary | ICD-10-CM | POA: Diagnosis not present

## 2023-07-07 DIAGNOSIS — E7800 Pure hypercholesterolemia, unspecified: Secondary | ICD-10-CM | POA: Diagnosis not present

## 2023-07-07 DIAGNOSIS — R5383 Other fatigue: Secondary | ICD-10-CM | POA: Diagnosis not present

## 2023-08-02 ENCOUNTER — Ambulatory Visit: Payer: Medicare Other | Admitting: Internal Medicine

## 2023-08-23 ENCOUNTER — Ambulatory Visit (INDEPENDENT_AMBULATORY_CARE_PROVIDER_SITE_OTHER): Payer: Medicare Other | Admitting: Internal Medicine

## 2023-08-23 ENCOUNTER — Encounter: Payer: Self-pay | Admitting: Internal Medicine

## 2023-08-23 VITALS — BP 113/80 | HR 68 | Temp 98.2°F | Ht 62.0 in | Wt 168.4 lb

## 2023-08-23 DIAGNOSIS — K5190 Ulcerative colitis, unspecified, without complications: Secondary | ICD-10-CM

## 2023-08-23 DIAGNOSIS — K529 Noninfective gastroenteritis and colitis, unspecified: Secondary | ICD-10-CM

## 2023-08-23 NOTE — Patient Instructions (Signed)
 I am happy to hear you doing well.  Continue on cholestyramine daily.  Follow-up in 6 months or sooner if needed.  It was very nice seeing you again today.  Dr. Marletta Lor

## 2023-08-23 NOTE — Progress Notes (Signed)
 Primary Care Physician:  Kirstie Peri, MD Primary Gastroenterologist:  Dr. Marletta Lor  Chief Complaint  Patient presents with   Follow-up    Follow up on UC and IBD. Pt states she is doing fine    HPI:   Karen Simpson is a 73 y.o. female who presents to clinic today for follow-up visit for IBD.  Originally diagnosed with UC. This was previously followed by Dr. Samuella Cota.  Was on sulfasalazine in the past though this was discontinued.    Colonoscopy 06/2016: hemorrhoids, inflammation (erythema, edema, micro-ulcerations, more in the proximal 2/3 of the colon. Path "nonspecific chronic inflammation of cecum and rectum".    Per OV note: patient treated with welchol, questran. Symptoms improved on mesalamine. "Advised to stay on Azulfidine 2gm/day with folic acid".   Reports her sister had colon cancer in the past.  Colonoscopy March 2022 which showed internal hemorrhoids, diverticulosis of the sigmoid descending colon, colon mucosa otherwise unremarkable.  Colon biopsies did show focal active colitis.  Patient was doing well until onset of diarrhea January 2024.  GI pathogen panel positive for norovirus.  Colonoscopy August 16, 2022. She had nonbleeding internal hemorrhoids. Diverticulosis. No endoscopic evidence of inflammation in the entire colon. Normal terminal ileum. Segmental colon biopsies showed mild inflammation of sigmoid colon, some of the chronic features (granulomas) more consistent with Crohn's colitis and not ulcerative colitis.  Biopsies of the cecum, transverse, descending colons showed mildly altered crypt architecture and granulomas.Marland Kitchen Special stains negative for acid-fast bacilli or fungi.   Subsequent IBD panel negative.  Today, states she is doing great.  Having normal bowel movements.  No melena hematochezia.  No mucus in her stool.  No abdominal pain.  Continues to take Questran 4 g daily.    Past Medical History:  Diagnosis Date   SCC (squamous cell carcinoma), leg     Ulcerative colitis [facility])     Past Surgical History:  Procedure Laterality Date   BIOPSY  09/07/2020   Procedure: BIOPSY;  Surgeon: Lanelle Bal, DO;  [facility];  Service: Endoscopy;;   BIOPSY  08/16/2022   Procedure: BIOPSY;  Surgeon: Lanelle Bal, DO;  [facility];  Service: Endoscopy;;   BREAST EXCISIONAL BIOPSY Left    BREAST EXCISIONAL BIOPSY Left    CHOLECYSTECTOMY     COLONOSCOPY WITH PROPOFOL N/A 09/07/2020   Procedure: COLONOSCOPY WITH PROPOFOL;  Surgeon: Lanelle Bal, DO;  [facility];  Service: Endoscopy;  Laterality: N/A;  am appt   COLONOSCOPY WITH PROPOFOL N/A 08/16/2022   Procedure: COLONOSCOPY WITH PROPOFOL;  Surgeon: Lanelle Bal, DO;  [facility];  Service: Endoscopy;  Laterality: N/A;  9:00am, asa 2   EYE SURGERY Bilateral    cataract   TONSILLECTOMY      Current Outpatient Medications  Medication Sig Dispense Refill   Ascorbic Acid (VITAMIN C) 1000 MG tablet Take 1,000 mg by mouth daily.     aspirin EC 81 MG tablet Take 81 mg by mouth daily. Swallow whole.     Calcium Carb-Cholecalciferol (CALCIUM 600 + D PO) Take 2 tablets by mouth daily.     cholestyramine (QUESTRAN) 4 g packet Take 1 packet (4 g total) by mouth daily. 30 packet 11   CRANBERRY PO Take 15,000 mg by mouth daily.     ELDERBERRY PO Take 50 mg by mouth daily.     gabapentin (NEURONTIN) 300 MG capsule Take 300 mg by mouth daily.  hydrocortisone (ANUSOL-HC) 2.5 % rectal cream Place 1 application rectally 2 (two) times daily. For 14 days. May repeat if needed. Do not use long-term. 30 g 1   meloxicam (MOBIC) 15 MG tablet Take 15 mg by mouth daily.     Multiple Vitamins-Minerals (CENTRUM SILVER 50+WOMEN PO) Take 1 tablet by mouth daily.     Probiotic Product (ALIGN PO) Take 1 capsule by mouth daily.     vitamin E 180 MG (400 UNITS) capsule Take 400 Units by mouth daily.     No current facility-administered medications for this  visit.    Allergies as of 08/23/2023 - Review Complete 08/23/2023  Allergen Reaction Noted   Ciprofloxacin  09/07/2021   Flagyl [metronidazole]  09/07/2021    Family History  Problem Relation Age of Onset   Colon cancer Sister    Stroke Brother     Social History   Socioeconomic History   Marital status: Single    Spouse name: Not on file   Number of children: Not on file   Years of education: Not on file   Highest education level: Not on file  Occupational History   Not on file  Tobacco Use   Smoking status: Never   Smokeless tobacco: Never  Vaping Use   Vaping status: Never Used  Substance and Sexual Activity   Alcohol use: Not Currently   Drug use: Never   Sexual activity: Not Currently  Other Topics Concern   Not on file  Social History Narrative   Not on file   Social Drivers of Health   Financial Resource Strain: Not on file  Food Insecurity: Not on file  Transportation Needs: Not on file  Physical Activity: Not on file  Stress: Not on file  Social Connections: Not on file  Intimate Partner Violence: Not on file    Subjective: Review of Systems  Constitutional:  Negative for chills and fever.  HENT:  Negative for congestion and hearing loss.   Eyes:  Negative for blurred vision and double vision.  Respiratory:  Negative for cough and shortness of breath.   Cardiovascular:  Negative for chest pain and palpitations.  Gastrointestinal:  Negative for abdominal pain, blood in stool, constipation, diarrhea, heartburn, melena and vomiting.  Genitourinary:  Negative for dysuria and urgency.  Musculoskeletal:  Negative for joint pain and myalgias.  Skin:  Negative for itching and rash.  Neurological:  Negative for dizziness and headaches.  Psychiatric/Behavioral:  Negative for depression. The patient is not nervous/anxious.        Objective: BP 113/80   Pulse 68   Temp 98.2 F (36.8 C)   Ht 5\' 2"  (1.575 m)   Wt 168 lb 6.4 oz (76.4 kg)   BMI 30.80  kg/m  Physical Exam Constitutional:      Appearance: Normal appearance.  HENT:     Head: Normocephalic and atraumatic.  Eyes:     Extraocular Movements: Extraocular movements intact.     Conjunctiva/sclera: Conjunctivae normal.  Cardiovascular:     Rate and Rhythm: Normal rate and regular rhythm.  Pulmonary:     Effort: Pulmonary effort is normal.     Breath sounds: Normal breath sounds.  Abdominal:     General: Bowel sounds are normal.     Palpations: Abdomen is soft.  Musculoskeletal:        General: No swelling. Normal range of motion.     Cervical back: Normal range of motion and neck supple.  Skin:    General:  Skin is warm and dry.     Coloration: Skin is not jaundiced.  Neurological:     General: No focal deficit present.     Mental Status: She is alert and oriented to person, place, and time.  Psychiatric:        Mood and Affect: Mood normal.        Behavior: Behavior normal.      Assessment: *Chronic colitis  Plan: Discussed patient's colonoscopy and path results in depth with her today.  Her biopsies raise the possibility of questionable Crohn's colitis.  Fortunately she appears to be in clinical remission.  Mild amount of inflammation noted in her sigmoid colon, otherwise chronic findings.  Will check inflammatory markers on follow-up visit including fecal calprotectin, ESR, CRP.  Continue on Questran daily.  Follow-up in 4 months.   08/23/2023 3:35 PM   Disclaimer: This note was dictated with voice recognition software. Similar sounding words can inadvertently be transcribed and may not be corrected upon review.

## 2023-11-03 ENCOUNTER — Other Ambulatory Visit: Payer: Self-pay | Admitting: Internal Medicine

## 2023-11-03 DIAGNOSIS — Z1231 Encounter for screening mammogram for malignant neoplasm of breast: Secondary | ICD-10-CM

## 2023-11-13 DIAGNOSIS — Z299 Encounter for prophylactic measures, unspecified: Secondary | ICD-10-CM | POA: Diagnosis not present

## 2023-11-13 DIAGNOSIS — M542 Cervicalgia: Secondary | ICD-10-CM | POA: Diagnosis not present

## 2023-11-13 DIAGNOSIS — E7800 Pure hypercholesterolemia, unspecified: Secondary | ICD-10-CM | POA: Diagnosis not present

## 2023-11-13 DIAGNOSIS — M549 Dorsalgia, unspecified: Secondary | ICD-10-CM | POA: Diagnosis not present

## 2023-11-13 DIAGNOSIS — N390 Urinary tract infection, site not specified: Secondary | ICD-10-CM | POA: Diagnosis not present

## 2023-11-13 DIAGNOSIS — R52 Pain, unspecified: Secondary | ICD-10-CM | POA: Diagnosis not present

## 2023-12-07 ENCOUNTER — Ambulatory Visit
Admission: RE | Admit: 2023-12-07 | Discharge: 2023-12-07 | Disposition: A | Discharge status: Home / Self Care | Source: Ambulatory Visit | Attending: Internal Medicine | Admitting: Internal Medicine

## 2023-12-07 DIAGNOSIS — Z1231 Encounter for screening mammogram for malignant neoplasm of breast: Secondary | ICD-10-CM

## 2024-01-04 DIAGNOSIS — Z299 Encounter for prophylactic measures, unspecified: Secondary | ICD-10-CM | POA: Diagnosis not present

## 2024-01-04 DIAGNOSIS — E7800 Pure hypercholesterolemia, unspecified: Secondary | ICD-10-CM | POA: Diagnosis not present

## 2024-01-04 DIAGNOSIS — M1990 Unspecified osteoarthritis, unspecified site: Secondary | ICD-10-CM | POA: Diagnosis not present

## 2024-01-04 DIAGNOSIS — K5190 Ulcerative colitis, unspecified, without complications: Secondary | ICD-10-CM | POA: Diagnosis not present

## 2024-01-29 ENCOUNTER — Encounter: Payer: Self-pay | Admitting: Internal Medicine

## 2024-02-15 DIAGNOSIS — M9901 Segmental and somatic dysfunction of cervical region: Secondary | ICD-10-CM | POA: Diagnosis not present

## 2024-02-15 DIAGNOSIS — M5416 Radiculopathy, lumbar region: Secondary | ICD-10-CM | POA: Diagnosis not present

## 2024-02-15 DIAGNOSIS — M25562 Pain in left knee: Secondary | ICD-10-CM | POA: Diagnosis not present

## 2024-02-15 DIAGNOSIS — M9905 Segmental and somatic dysfunction of pelvic region: Secondary | ICD-10-CM | POA: Diagnosis not present

## 2024-02-15 DIAGNOSIS — M51361 Other intervertebral disc degeneration, lumbar region with lower extremity pain only: Secondary | ICD-10-CM | POA: Diagnosis not present

## 2024-02-15 DIAGNOSIS — M25561 Pain in right knee: Secondary | ICD-10-CM | POA: Diagnosis not present

## 2024-02-15 DIAGNOSIS — M9903 Segmental and somatic dysfunction of lumbar region: Secondary | ICD-10-CM | POA: Diagnosis not present

## 2024-02-15 DIAGNOSIS — M62838 Other muscle spasm: Secondary | ICD-10-CM | POA: Diagnosis not present

## 2024-02-15 DIAGNOSIS — G9001 Carotid sinus syncope: Secondary | ICD-10-CM | POA: Diagnosis not present

## 2024-02-15 DIAGNOSIS — M5030 Other cervical disc degeneration, unspecified cervical region: Secondary | ICD-10-CM | POA: Diagnosis not present

## 2024-02-15 DIAGNOSIS — M9902 Segmental and somatic dysfunction of thoracic region: Secondary | ICD-10-CM | POA: Diagnosis not present

## 2024-02-15 DIAGNOSIS — M9906 Segmental and somatic dysfunction of lower extremity: Secondary | ICD-10-CM | POA: Diagnosis not present

## 2024-02-26 DIAGNOSIS — M51361 Other intervertebral disc degeneration, lumbar region with lower extremity pain only: Secondary | ICD-10-CM | POA: Diagnosis not present

## 2024-02-26 DIAGNOSIS — M5030 Other cervical disc degeneration, unspecified cervical region: Secondary | ICD-10-CM | POA: Diagnosis not present

## 2024-02-26 DIAGNOSIS — M9906 Segmental and somatic dysfunction of lower extremity: Secondary | ICD-10-CM | POA: Diagnosis not present

## 2024-02-26 DIAGNOSIS — M9902 Segmental and somatic dysfunction of thoracic region: Secondary | ICD-10-CM | POA: Diagnosis not present

## 2024-02-26 DIAGNOSIS — M25561 Pain in right knee: Secondary | ICD-10-CM | POA: Diagnosis not present

## 2024-02-26 DIAGNOSIS — M9901 Segmental and somatic dysfunction of cervical region: Secondary | ICD-10-CM | POA: Diagnosis not present

## 2024-02-26 DIAGNOSIS — M25562 Pain in left knee: Secondary | ICD-10-CM | POA: Diagnosis not present

## 2024-02-26 DIAGNOSIS — M9905 Segmental and somatic dysfunction of pelvic region: Secondary | ICD-10-CM | POA: Diagnosis not present

## 2024-02-26 DIAGNOSIS — M5416 Radiculopathy, lumbar region: Secondary | ICD-10-CM | POA: Diagnosis not present

## 2024-02-26 DIAGNOSIS — G9001 Carotid sinus syncope: Secondary | ICD-10-CM | POA: Diagnosis not present

## 2024-02-26 DIAGNOSIS — M9903 Segmental and somatic dysfunction of lumbar region: Secondary | ICD-10-CM | POA: Diagnosis not present

## 2024-02-26 DIAGNOSIS — M62838 Other muscle spasm: Secondary | ICD-10-CM | POA: Diagnosis not present

## 2024-02-28 DIAGNOSIS — M9903 Segmental and somatic dysfunction of lumbar region: Secondary | ICD-10-CM | POA: Diagnosis not present

## 2024-02-28 DIAGNOSIS — M9906 Segmental and somatic dysfunction of lower extremity: Secondary | ICD-10-CM | POA: Diagnosis not present

## 2024-02-28 DIAGNOSIS — M62838 Other muscle spasm: Secondary | ICD-10-CM | POA: Diagnosis not present

## 2024-02-28 DIAGNOSIS — M9901 Segmental and somatic dysfunction of cervical region: Secondary | ICD-10-CM | POA: Diagnosis not present

## 2024-02-28 DIAGNOSIS — M25562 Pain in left knee: Secondary | ICD-10-CM | POA: Diagnosis not present

## 2024-02-28 DIAGNOSIS — M25561 Pain in right knee: Secondary | ICD-10-CM | POA: Diagnosis not present

## 2024-02-28 DIAGNOSIS — M9902 Segmental and somatic dysfunction of thoracic region: Secondary | ICD-10-CM | POA: Diagnosis not present

## 2024-02-28 DIAGNOSIS — G8929 Other chronic pain: Secondary | ICD-10-CM | POA: Diagnosis not present

## 2024-02-28 DIAGNOSIS — M51361 Other intervertebral disc degeneration, lumbar region with lower extremity pain only: Secondary | ICD-10-CM | POA: Diagnosis not present

## 2024-02-28 DIAGNOSIS — M5416 Radiculopathy, lumbar region: Secondary | ICD-10-CM | POA: Diagnosis not present

## 2024-02-28 DIAGNOSIS — M5030 Other cervical disc degeneration, unspecified cervical region: Secondary | ICD-10-CM | POA: Diagnosis not present

## 2024-02-28 DIAGNOSIS — G9001 Carotid sinus syncope: Secondary | ICD-10-CM | POA: Diagnosis not present

## 2024-02-28 DIAGNOSIS — M9905 Segmental and somatic dysfunction of pelvic region: Secondary | ICD-10-CM | POA: Diagnosis not present

## 2024-03-04 DIAGNOSIS — M542 Cervicalgia: Secondary | ICD-10-CM | POA: Diagnosis not present

## 2024-03-04 DIAGNOSIS — M5030 Other cervical disc degeneration, unspecified cervical region: Secondary | ICD-10-CM | POA: Diagnosis not present

## 2024-03-04 DIAGNOSIS — M9901 Segmental and somatic dysfunction of cervical region: Secondary | ICD-10-CM | POA: Diagnosis not present

## 2024-03-04 DIAGNOSIS — M25562 Pain in left knee: Secondary | ICD-10-CM | POA: Diagnosis not present

## 2024-03-04 DIAGNOSIS — M9906 Segmental and somatic dysfunction of lower extremity: Secondary | ICD-10-CM | POA: Diagnosis not present

## 2024-03-04 DIAGNOSIS — M9903 Segmental and somatic dysfunction of lumbar region: Secondary | ICD-10-CM | POA: Diagnosis not present

## 2024-03-04 DIAGNOSIS — M51361 Other intervertebral disc degeneration, lumbar region with lower extremity pain only: Secondary | ICD-10-CM | POA: Diagnosis not present

## 2024-03-04 DIAGNOSIS — M9905 Segmental and somatic dysfunction of pelvic region: Secondary | ICD-10-CM | POA: Diagnosis not present

## 2024-03-04 DIAGNOSIS — M9902 Segmental and somatic dysfunction of thoracic region: Secondary | ICD-10-CM | POA: Diagnosis not present

## 2024-03-04 DIAGNOSIS — M25561 Pain in right knee: Secondary | ICD-10-CM | POA: Diagnosis not present

## 2024-03-04 DIAGNOSIS — M545 Low back pain, unspecified: Secondary | ICD-10-CM | POA: Diagnosis not present

## 2024-03-04 DIAGNOSIS — M5416 Radiculopathy, lumbar region: Secondary | ICD-10-CM | POA: Diagnosis not present

## 2024-03-04 DIAGNOSIS — G9001 Carotid sinus syncope: Secondary | ICD-10-CM | POA: Diagnosis not present

## 2024-03-04 DIAGNOSIS — M62838 Other muscle spasm: Secondary | ICD-10-CM | POA: Diagnosis not present

## 2024-03-07 DIAGNOSIS — M9905 Segmental and somatic dysfunction of pelvic region: Secondary | ICD-10-CM | POA: Diagnosis not present

## 2024-03-07 DIAGNOSIS — G9001 Carotid sinus syncope: Secondary | ICD-10-CM | POA: Diagnosis not present

## 2024-03-07 DIAGNOSIS — M9906 Segmental and somatic dysfunction of lower extremity: Secondary | ICD-10-CM | POA: Diagnosis not present

## 2024-03-07 DIAGNOSIS — M542 Cervicalgia: Secondary | ICD-10-CM | POA: Diagnosis not present

## 2024-03-07 DIAGNOSIS — M9901 Segmental and somatic dysfunction of cervical region: Secondary | ICD-10-CM | POA: Diagnosis not present

## 2024-03-07 DIAGNOSIS — M545 Low back pain, unspecified: Secondary | ICD-10-CM | POA: Diagnosis not present

## 2024-03-07 DIAGNOSIS — M5030 Other cervical disc degeneration, unspecified cervical region: Secondary | ICD-10-CM | POA: Diagnosis not present

## 2024-03-07 DIAGNOSIS — M9903 Segmental and somatic dysfunction of lumbar region: Secondary | ICD-10-CM | POA: Diagnosis not present

## 2024-03-07 DIAGNOSIS — M62838 Other muscle spasm: Secondary | ICD-10-CM | POA: Diagnosis not present

## 2024-03-07 DIAGNOSIS — M25561 Pain in right knee: Secondary | ICD-10-CM | POA: Diagnosis not present

## 2024-03-07 DIAGNOSIS — M25562 Pain in left knee: Secondary | ICD-10-CM | POA: Diagnosis not present

## 2024-03-07 DIAGNOSIS — M5416 Radiculopathy, lumbar region: Secondary | ICD-10-CM | POA: Diagnosis not present

## 2024-03-07 DIAGNOSIS — M51361 Other intervertebral disc degeneration, lumbar region with lower extremity pain only: Secondary | ICD-10-CM | POA: Diagnosis not present

## 2024-03-07 DIAGNOSIS — M9902 Segmental and somatic dysfunction of thoracic region: Secondary | ICD-10-CM | POA: Diagnosis not present

## 2024-03-11 DIAGNOSIS — M9906 Segmental and somatic dysfunction of lower extremity: Secondary | ICD-10-CM | POA: Diagnosis not present

## 2024-03-11 DIAGNOSIS — M5030 Other cervical disc degeneration, unspecified cervical region: Secondary | ICD-10-CM | POA: Diagnosis not present

## 2024-03-11 DIAGNOSIS — M62838 Other muscle spasm: Secondary | ICD-10-CM | POA: Diagnosis not present

## 2024-03-11 DIAGNOSIS — M9905 Segmental and somatic dysfunction of pelvic region: Secondary | ICD-10-CM | POA: Diagnosis not present

## 2024-03-11 DIAGNOSIS — M5416 Radiculopathy, lumbar region: Secondary | ICD-10-CM | POA: Diagnosis not present

## 2024-03-11 DIAGNOSIS — M25561 Pain in right knee: Secondary | ICD-10-CM | POA: Diagnosis not present

## 2024-03-11 DIAGNOSIS — M545 Low back pain, unspecified: Secondary | ICD-10-CM | POA: Diagnosis not present

## 2024-03-11 DIAGNOSIS — G9001 Carotid sinus syncope: Secondary | ICD-10-CM | POA: Diagnosis not present

## 2024-03-11 DIAGNOSIS — M542 Cervicalgia: Secondary | ICD-10-CM | POA: Diagnosis not present

## 2024-03-11 DIAGNOSIS — M9902 Segmental and somatic dysfunction of thoracic region: Secondary | ICD-10-CM | POA: Diagnosis not present

## 2024-03-11 DIAGNOSIS — M25562 Pain in left knee: Secondary | ICD-10-CM | POA: Diagnosis not present

## 2024-03-11 DIAGNOSIS — M9903 Segmental and somatic dysfunction of lumbar region: Secondary | ICD-10-CM | POA: Diagnosis not present

## 2024-03-11 DIAGNOSIS — M51361 Other intervertebral disc degeneration, lumbar region with lower extremity pain only: Secondary | ICD-10-CM | POA: Diagnosis not present

## 2024-03-11 DIAGNOSIS — M9901 Segmental and somatic dysfunction of cervical region: Secondary | ICD-10-CM | POA: Diagnosis not present

## 2024-03-13 DIAGNOSIS — M9901 Segmental and somatic dysfunction of cervical region: Secondary | ICD-10-CM | POA: Diagnosis not present

## 2024-03-13 DIAGNOSIS — M9903 Segmental and somatic dysfunction of lumbar region: Secondary | ICD-10-CM | POA: Diagnosis not present

## 2024-03-13 DIAGNOSIS — M542 Cervicalgia: Secondary | ICD-10-CM | POA: Diagnosis not present

## 2024-03-13 DIAGNOSIS — M5030 Other cervical disc degeneration, unspecified cervical region: Secondary | ICD-10-CM | POA: Diagnosis not present

## 2024-03-13 DIAGNOSIS — M62838 Other muscle spasm: Secondary | ICD-10-CM | POA: Diagnosis not present

## 2024-03-13 DIAGNOSIS — M545 Low back pain, unspecified: Secondary | ICD-10-CM | POA: Diagnosis not present

## 2024-03-13 DIAGNOSIS — G9001 Carotid sinus syncope: Secondary | ICD-10-CM | POA: Diagnosis not present

## 2024-03-13 DIAGNOSIS — M25562 Pain in left knee: Secondary | ICD-10-CM | POA: Diagnosis not present

## 2024-03-13 DIAGNOSIS — M5416 Radiculopathy, lumbar region: Secondary | ICD-10-CM | POA: Diagnosis not present

## 2024-03-13 DIAGNOSIS — M9905 Segmental and somatic dysfunction of pelvic region: Secondary | ICD-10-CM | POA: Diagnosis not present

## 2024-03-13 DIAGNOSIS — M25561 Pain in right knee: Secondary | ICD-10-CM | POA: Diagnosis not present

## 2024-03-13 DIAGNOSIS — M9906 Segmental and somatic dysfunction of lower extremity: Secondary | ICD-10-CM | POA: Diagnosis not present

## 2024-03-13 DIAGNOSIS — M9902 Segmental and somatic dysfunction of thoracic region: Secondary | ICD-10-CM | POA: Diagnosis not present

## 2024-03-13 DIAGNOSIS — M51361 Other intervertebral disc degeneration, lumbar region with lower extremity pain only: Secondary | ICD-10-CM | POA: Diagnosis not present

## 2024-03-15 DIAGNOSIS — Z23 Encounter for immunization: Secondary | ICD-10-CM | POA: Diagnosis not present

## 2024-03-16 IMAGING — MG MM DIGITAL DIAGNOSTIC UNILAT*R* W/ TOMO W/ CAD
8 series · 8 of 24 positions shown · non-contrast
Comparison: Previous exam(s).

CLINICAL DATA: 70-year-old female presenting as a recall from
screening for possible right breast distortion.

EXAM:
DIGITAL DIAGNOSTIC UNILATERAL RIGHT MAMMOGRAM WITH TOMOSYNTHESIS AND
CAD
TECHNIQUE: Right digital diagnostic mammography and breast tomosynthesis was
performed. The images were evaluated with computer-aided detection.

[R MLO synth-2D (1 of 2)]
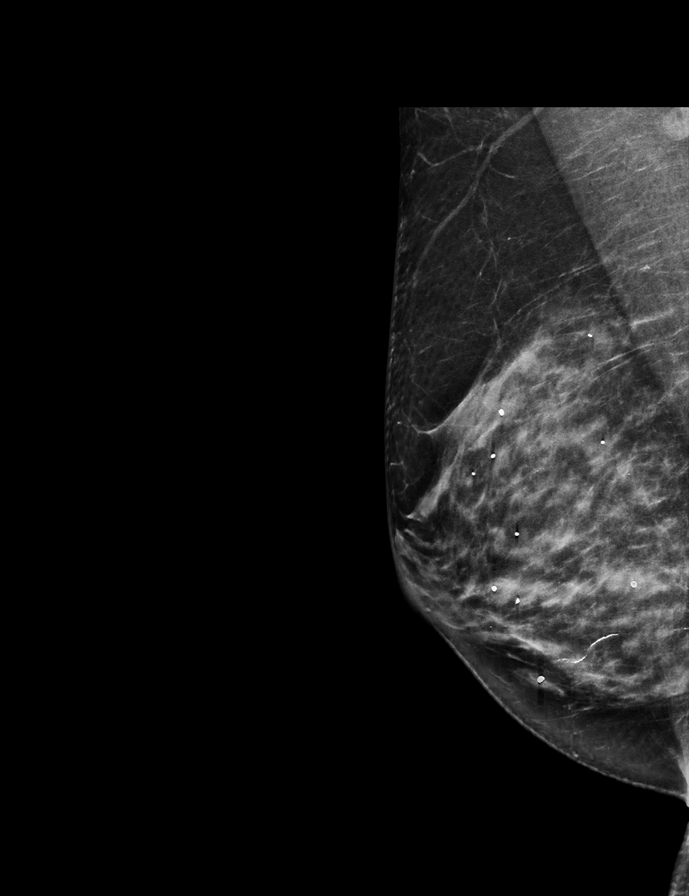

[R ML synth-2D]
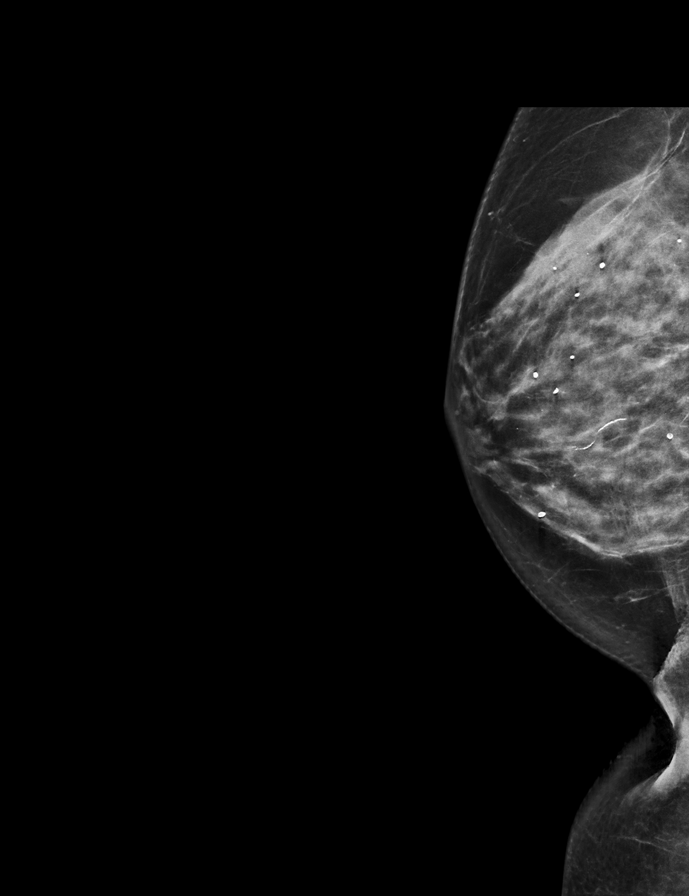

[R MLO synth-2D (2 of 2)]
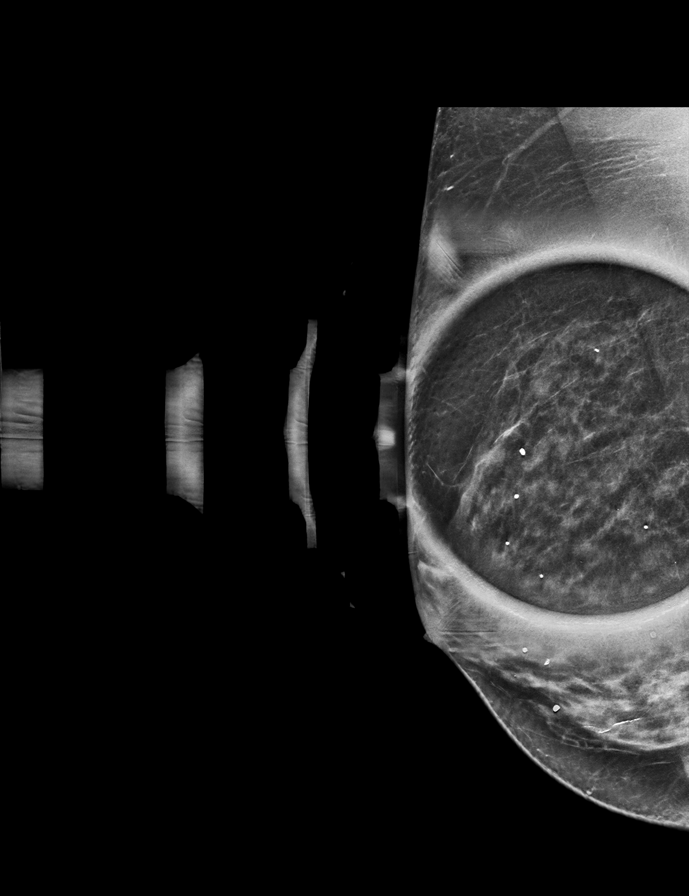

[R CC synth-2D]
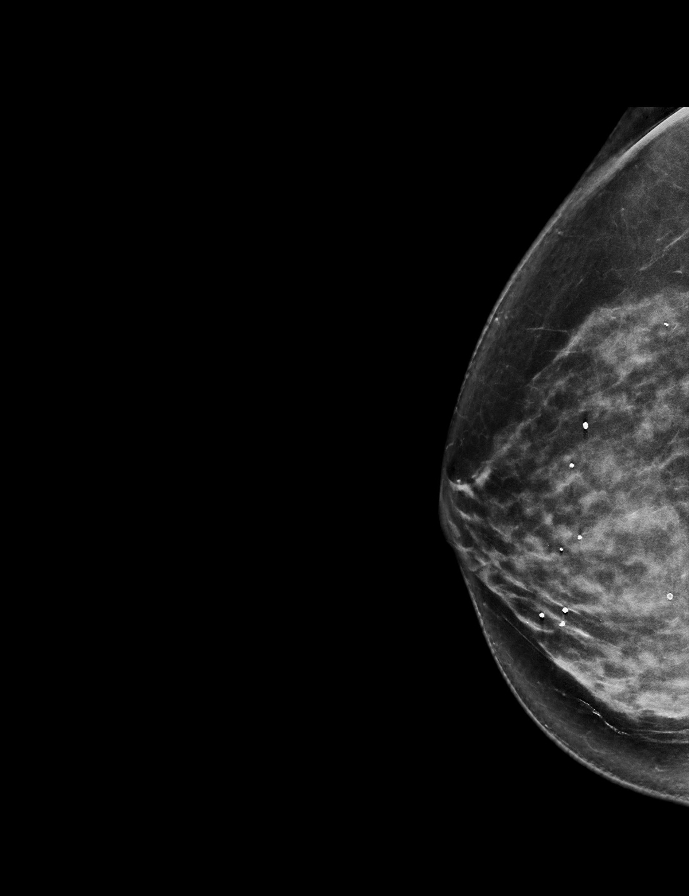

[R CC tomo · tomo slice 37/73.0]
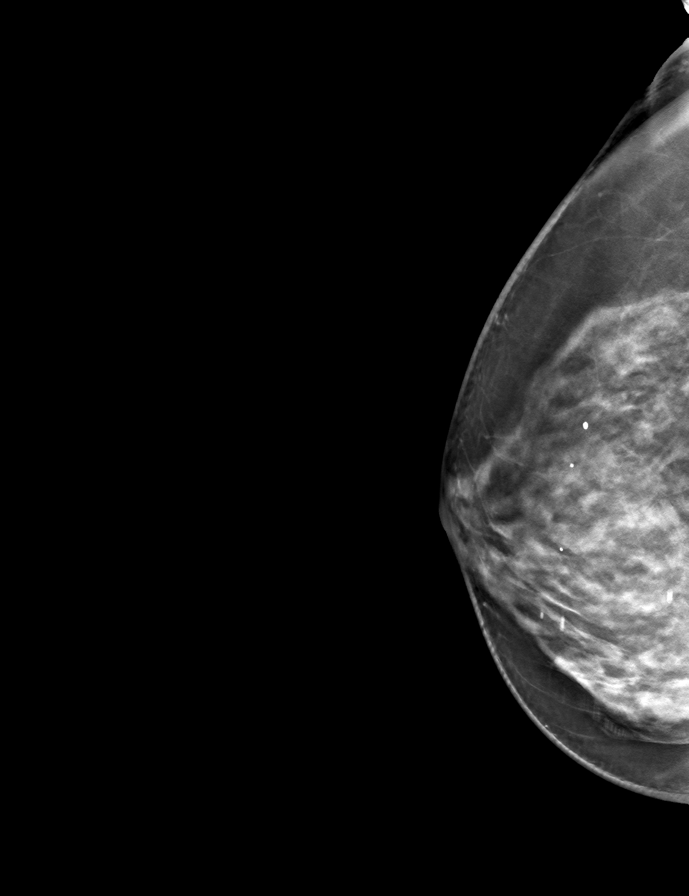

[R MLO tomo (1 of 2) · tomo slice 33/66.0]
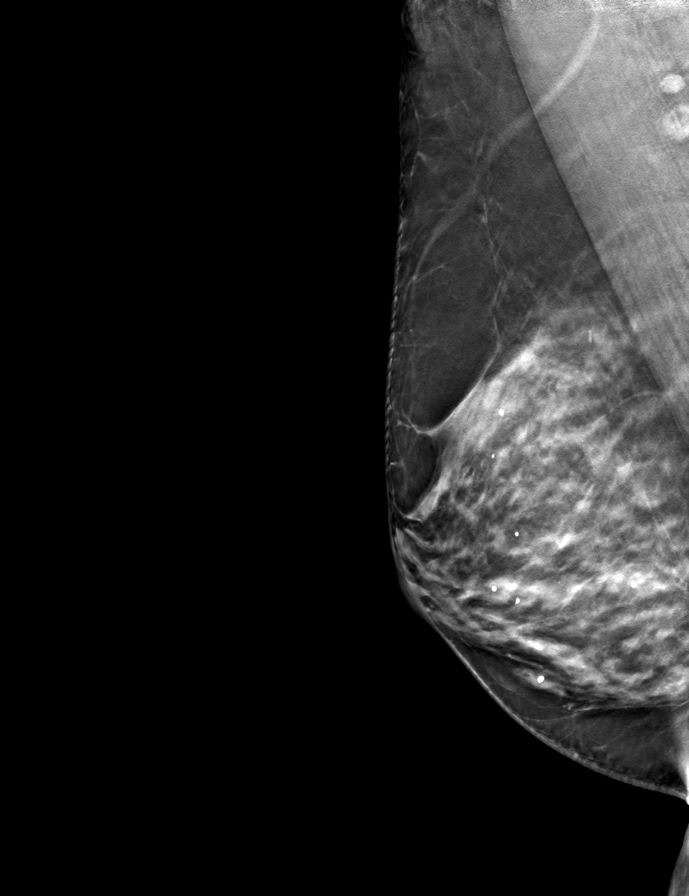

[R ML tomo · tomo slice 35/70.0]
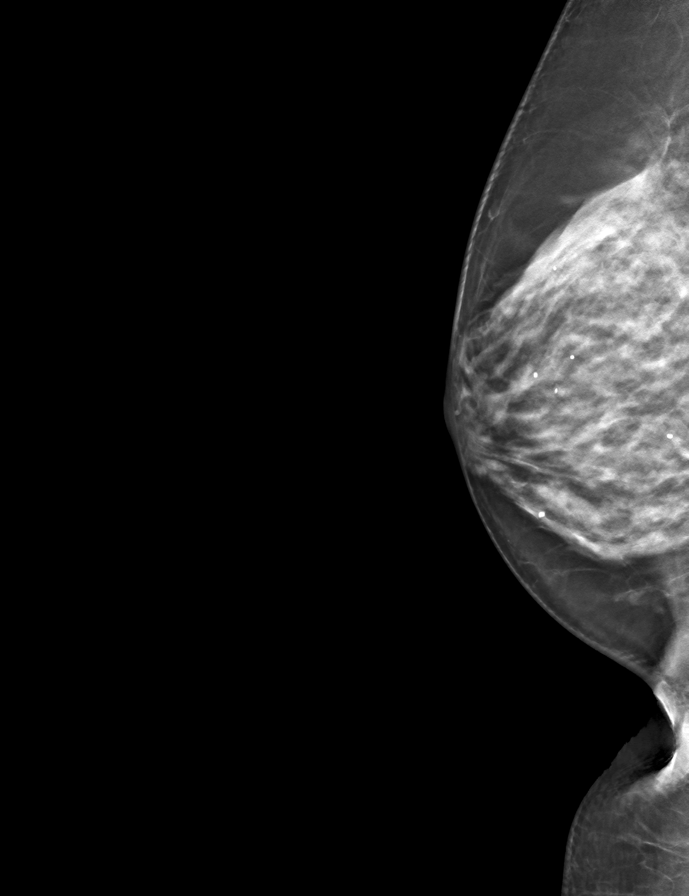

[R MLO tomo (2 of 2) · tomo slice 31/61.0]
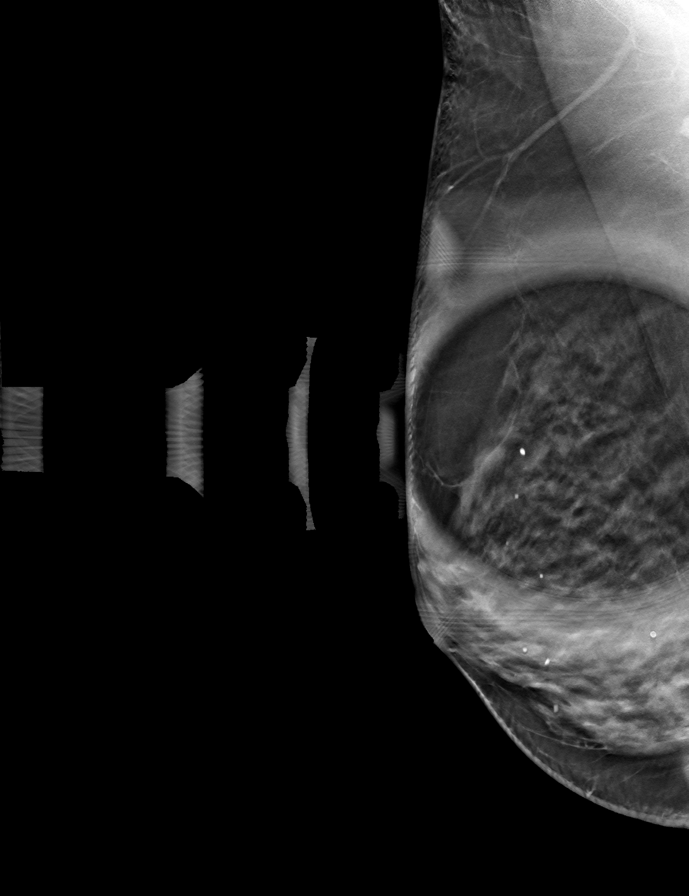

[8 of 24 positions shown; findings below may reference images not displayed]

ACR Breast Density Category c: The breast tissue is heterogeneously
dense, which may obscure small masses.
FINDINGS: Spot compression tomosynthesis CC as well as full field mL
tomosynthesis and spot compression tomosynthesis MLO views of the
right breast were performed for a questioned asymmetry seen only on
MLO view in the central posterior right breast. On the additional
imaging there is no persistent distortion or mass. The finding on
screening mammogram most likely represented normal overlapping
fibroglandular tissue. No new suspicious findings in the right
breast.
IMPRESSION: Resolution of the questioned distortion seen on screening mammogram
in the right breast.

RECOMMENDATION:
Screening mammogram in one year.(Code:62-M-16K)

I have discussed the findings and recommendations with the patient.
If applicable, a reminder letter will be sent to the patient
regarding the next appointment.

BI-RADS CATEGORY  1: Negative.

## 2024-03-18 ENCOUNTER — Ambulatory Visit (INDEPENDENT_AMBULATORY_CARE_PROVIDER_SITE_OTHER): Admitting: Gastroenterology

## 2024-03-18 VITALS — BP 125/82 | HR 77 | Temp 98.4°F | Ht 62.0 in | Wt 161.0 lb

## 2024-03-18 DIAGNOSIS — M542 Cervicalgia: Secondary | ICD-10-CM | POA: Diagnosis not present

## 2024-03-18 DIAGNOSIS — M545 Low back pain, unspecified: Secondary | ICD-10-CM | POA: Diagnosis not present

## 2024-03-18 DIAGNOSIS — G9001 Carotid sinus syncope: Secondary | ICD-10-CM | POA: Diagnosis not present

## 2024-03-18 DIAGNOSIS — M5030 Other cervical disc degeneration, unspecified cervical region: Secondary | ICD-10-CM | POA: Diagnosis not present

## 2024-03-18 DIAGNOSIS — M9905 Segmental and somatic dysfunction of pelvic region: Secondary | ICD-10-CM | POA: Diagnosis not present

## 2024-03-18 DIAGNOSIS — M9902 Segmental and somatic dysfunction of thoracic region: Secondary | ICD-10-CM | POA: Diagnosis not present

## 2024-03-18 DIAGNOSIS — M62838 Other muscle spasm: Secondary | ICD-10-CM | POA: Diagnosis not present

## 2024-03-18 DIAGNOSIS — K529 Noninfective gastroenteritis and colitis, unspecified: Secondary | ICD-10-CM | POA: Diagnosis not present

## 2024-03-18 DIAGNOSIS — M9906 Segmental and somatic dysfunction of lower extremity: Secondary | ICD-10-CM | POA: Diagnosis not present

## 2024-03-18 DIAGNOSIS — M9903 Segmental and somatic dysfunction of lumbar region: Secondary | ICD-10-CM | POA: Diagnosis not present

## 2024-03-18 DIAGNOSIS — M5416 Radiculopathy, lumbar region: Secondary | ICD-10-CM | POA: Diagnosis not present

## 2024-03-18 DIAGNOSIS — M51361 Other intervertebral disc degeneration, lumbar region with lower extremity pain only: Secondary | ICD-10-CM | POA: Diagnosis not present

## 2024-03-18 DIAGNOSIS — M25562 Pain in left knee: Secondary | ICD-10-CM | POA: Diagnosis not present

## 2024-03-18 DIAGNOSIS — M9901 Segmental and somatic dysfunction of cervical region: Secondary | ICD-10-CM | POA: Diagnosis not present

## 2024-03-18 DIAGNOSIS — M25561 Pain in right knee: Secondary | ICD-10-CM | POA: Diagnosis not present

## 2024-03-18 NOTE — Progress Notes (Signed)
 GI Office Note    Referring Provider: Maree Isles, MD Primary Care Physician:  Maree Isles, MD  Primary Gastroenterologist: Carlin POUR. Cindie, DO   Chief Complaint   Chief Complaint  Patient presents with   Follow-up    Follow up on IBD. Pt states she is doing great and she hasn't had any problems    History of Present Illness   Karen Simpson is a 73 y.o. female presenting today for follow up IBD. Last seen 08/2023.   Originally diagnosed with UC, previously followed by Dr. Gaylia. Was on sulfasalazine in the past though this was discontinued.    Colonoscopy 06/2016: hemorrhoids, inflammation (erythema, edema, micro-ulcerations, more in the proximal 2/3 of the colon). Path nonspecific chronic inflammation of cecum and rectum.   Per OV note: patient treated with welchol, questran . Symptoms improved on mesalamine. Advised to stay on Azulfidine 2gm/day with folic acid .   Reports her sister had colon cancer in the past.   Colonoscopy March 2022 which showed internal hemorrhoids, diverticulosis of the sigmoid descending colon, colon mucosa otherwise unremarkable.  Colon biopsies did show focal active colitis.   Patient was doing well until onset of diarrhea January 2024.  GI pathogen panel positive for norovirus.   Colonoscopy August 16, 2022. She had nonbleeding internal hemorrhoids. Diverticulosis. No endoscopic evidence of inflammation in the entire colon. Normal terminal ileum. Segmental colon biopsies showed mild inflammation of sigmoid colon, some of the chronic features (granulomas) more consistent with Crohn's colitis and not ulcerative colitis.  Biopsies of the cecum, transverse, descending colons showed mildly altered crypt architecture and granulomas.SABRA Special stains negative for acid-fast bacilli or fungi.    Subsequent IBD panel negative. ACE negative.    Discussed the use of AI scribe software for clinical note transcription with the patient, who gave verbal  consent to proceed.   She has not taken Questran  for a long time and has a 90-day supply at home without needing a refill. Her bowel movements are generally normal, occurring daily, unless she consumes certain foods. After eating at a deli recently (chicken salad/mac n cheese), she experienced diarrhea. Her friend had diarrhea as well. Symptoms resolved after taking Imodium. No blood in stool or frequent abdominal pain.    She actively manages her weight and neuropathy by attending chiropractic sessions twice a week which also involves exercises and exercising at a local park twice a week. She is also counting her [employer] points again.    Wt Readings from Last 3 Encounters:  03/18/24 161 lb (73 kg)  08/23/23 168 lb 6.4 oz (76.4 kg)  04/05/23 166 lb 14.4 oz (75.7 kg)     Medications   Current Outpatient Medications  Medication Sig Dispense Refill   Ascorbic Acid (VITAMIN C) 1000 MG tablet Take 1,000 mg by mouth daily.     aspirin EC 81 MG tablet Take 81 mg by mouth daily. Swallow whole.     Calcium Carb-Cholecalciferol (CALCIUM 600 + D PO) Take 2 tablets by mouth daily.     cholestyramine  (QUESTRAN ) 4 g packet Take 1 packet (4 g total) by mouth daily. 30 packet 11   CRANBERRY PO Take 15,000 mg by mouth daily.     ELDERBERRY PO Take 50 mg by mouth daily.     gabapentin (NEURONTIN) 300 MG capsule Take 300 mg by mouth daily.     hydrocortisone  (ANUSOL -HC) 2.5 % rectal cream Place 1 application rectally 2 (two) times daily. For 14 days. May repeat  if needed. Do not use long-term. 30 g 1   meloxicam (MOBIC) 15 MG tablet Take 15 mg by mouth daily.     Multiple Vitamins-Minerals (CENTRUM SILVER 50+WOMEN PO) Take 1 tablet by mouth daily.     Probiotic Product (ALIGN PO) Take 1 capsule by mouth daily.     vitamin E 180 MG (400 UNITS) capsule Take 400 Units by mouth daily.     No current facility-administered medications for this visit.    Allergies   Allergies as of 03/18/2024 - Review Complete  03/18/2024  Allergen Reaction Noted   Ciprofloxacin  09/07/2021   Flagyl [metronidazole]  09/07/2021      Review of Systems   General: Negative for anorexia, unintentional weight loss, fever, chills, fatigue, weakness. ENT: Negative for hoarseness, difficulty swallowing , nasal congestion. CV: Negative for chest pain, angina, palpitations, dyspnea on exertion, peripheral edema.  Respiratory: Negative for dyspnea at rest, dyspnea on exertion, cough, sputum, wheezing.  GI: See history of present illness. GU:  Negative for dysuria, hematuria, urinary incontinence, urinary frequency, nocturnal urination.  Endo: Negative for unusual weight change.     Physical Exam   BP 125/82   Pulse 77   Temp 98.4 F (36.9 C)   Ht 5' 2 (1.575 m)   Wt 161 lb (73 kg)   BMI 29.45 kg/m    General: Well-nourished, well-developed in no acute distress.  Eyes: No icterus. Mouth: Oropharyngeal mucosa moist and pink   Abdomen: Bowel sounds are normal, nontender, nondistended, no hepatosplenomegaly or masses,  no abdominal bruits or hernia , no rebound or guarding.  Rectal: not performed Extremities: No lower extremity edema. No clubbing or deformities. Neuro: Alert and oriented x 4   Skin: Warm and dry, no jaundice.   Psych: Alert and cooperative, normal mood and affect.  Labs   Lab Results  Component Value Date   VITAMINB12 366 04/05/2023   Lab Results  Component Value Date   FOLATE >20.0 04/05/2023   Lab Results  Component Value Date   WBC 8.8 04/05/2023   HGB 12.6 04/05/2023   HCT 40.0 04/05/2023   MCV 92 04/05/2023   PLT 258 04/05/2023   Lab Results  Component Value Date   NA 144 04/05/2023   CL 104 04/05/2023   K 4.4 04/05/2023   CO2 24 04/05/2023   BUN 20 04/05/2023   CREATININE 0.66 04/05/2023   EGFR 92 04/05/2023   CALCIUM 9.7 04/05/2023   ALBUMIN 4.3 04/05/2023   GLUCOSE 85 04/05/2023   Lab Results  Component Value Date   ALT 27 04/05/2023   AST 24 04/05/2023    ALKPHOS 87 04/05/2023   BILITOT <0.2 04/05/2023    Imaging Studies   No results found.  Assessment/Plan:   Chronic colitis: initially diagnosed with UC years ago by another GI provider. Her colonoscopy with biopsies 08/2022 raises question of crohn's colitis. Clinical remission with only mild inflammation in sigmoid colon, otherwise chronic findings. IBD panel negative 03/2023. We do not have baseline fecal calprotectin or sed rate/CRP. Clinically she continues to do well, with normal BMs, no significant abdominal pain, rarely using cholestyramine .  -fecal calprotectin, sed rate, CRP -Continue cholestyramine  one packet daily as needed, separating from other medications by 3 hours.  -next colonoscopy likely 08/2024 (2 yr surveillance) but await labs/stool.     Sonny RAMAN. Ezzard, MHS, PA-C [facility]

## 2024-03-18 NOTE — Patient Instructions (Signed)
 Continue cholestyramine  powder (one packet) daily as needed for diarrhea.  Complete stool and labs at [facility] at your convenience. Return to the office in six months. Call if you have any problems with persistent diarrhea, abdominal pain in the interim.

## 2024-03-20 DIAGNOSIS — M25561 Pain in right knee: Secondary | ICD-10-CM | POA: Diagnosis not present

## 2024-03-20 DIAGNOSIS — M791 Myalgia, unspecified site: Secondary | ICD-10-CM | POA: Diagnosis not present

## 2024-03-20 DIAGNOSIS — M9905 Segmental and somatic dysfunction of pelvic region: Secondary | ICD-10-CM | POA: Diagnosis not present

## 2024-03-20 DIAGNOSIS — M545 Low back pain, unspecified: Secondary | ICD-10-CM | POA: Diagnosis not present

## 2024-03-20 DIAGNOSIS — M9906 Segmental and somatic dysfunction of lower extremity: Secondary | ICD-10-CM | POA: Diagnosis not present

## 2024-03-20 DIAGNOSIS — M5416 Radiculopathy, lumbar region: Secondary | ICD-10-CM | POA: Diagnosis not present

## 2024-03-20 DIAGNOSIS — M9902 Segmental and somatic dysfunction of thoracic region: Secondary | ICD-10-CM | POA: Diagnosis not present

## 2024-03-20 DIAGNOSIS — G9001 Carotid sinus syncope: Secondary | ICD-10-CM | POA: Diagnosis not present

## 2024-03-20 DIAGNOSIS — M25562 Pain in left knee: Secondary | ICD-10-CM | POA: Diagnosis not present

## 2024-03-20 DIAGNOSIS — M5030 Other cervical disc degeneration, unspecified cervical region: Secondary | ICD-10-CM | POA: Diagnosis not present

## 2024-03-20 DIAGNOSIS — M62838 Other muscle spasm: Secondary | ICD-10-CM | POA: Diagnosis not present

## 2024-03-20 DIAGNOSIS — M9901 Segmental and somatic dysfunction of cervical region: Secondary | ICD-10-CM | POA: Diagnosis not present

## 2024-03-20 DIAGNOSIS — M51361 Other intervertebral disc degeneration, lumbar region with lower extremity pain only: Secondary | ICD-10-CM | POA: Diagnosis not present

## 2024-03-20 DIAGNOSIS — M9903 Segmental and somatic dysfunction of lumbar region: Secondary | ICD-10-CM | POA: Diagnosis not present

## 2024-03-20 DIAGNOSIS — M542 Cervicalgia: Secondary | ICD-10-CM | POA: Diagnosis not present

## 2024-03-22 DIAGNOSIS — E2839 Other primary ovarian failure: Secondary | ICD-10-CM | POA: Diagnosis not present

## 2024-03-25 DIAGNOSIS — M62838 Other muscle spasm: Secondary | ICD-10-CM | POA: Diagnosis not present

## 2024-03-25 DIAGNOSIS — M9903 Segmental and somatic dysfunction of lumbar region: Secondary | ICD-10-CM | POA: Diagnosis not present

## 2024-03-25 DIAGNOSIS — M9902 Segmental and somatic dysfunction of thoracic region: Secondary | ICD-10-CM | POA: Diagnosis not present

## 2024-03-25 DIAGNOSIS — M9906 Segmental and somatic dysfunction of lower extremity: Secondary | ICD-10-CM | POA: Diagnosis not present

## 2024-03-25 DIAGNOSIS — M5416 Radiculopathy, lumbar region: Secondary | ICD-10-CM | POA: Diagnosis not present

## 2024-03-25 DIAGNOSIS — M25561 Pain in right knee: Secondary | ICD-10-CM | POA: Diagnosis not present

## 2024-03-25 DIAGNOSIS — G9001 Carotid sinus syncope: Secondary | ICD-10-CM | POA: Diagnosis not present

## 2024-03-25 DIAGNOSIS — M791 Myalgia, unspecified site: Secondary | ICD-10-CM | POA: Diagnosis not present

## 2024-03-25 DIAGNOSIS — M5030 Other cervical disc degeneration, unspecified cervical region: Secondary | ICD-10-CM | POA: Diagnosis not present

## 2024-03-25 DIAGNOSIS — M25572 Pain in left ankle and joints of left foot: Secondary | ICD-10-CM | POA: Diagnosis not present

## 2024-03-25 DIAGNOSIS — M9901 Segmental and somatic dysfunction of cervical region: Secondary | ICD-10-CM | POA: Diagnosis not present

## 2024-03-25 DIAGNOSIS — K529 Noninfective gastroenteritis and colitis, unspecified: Secondary | ICD-10-CM | POA: Diagnosis not present

## 2024-03-25 DIAGNOSIS — M25562 Pain in left knee: Secondary | ICD-10-CM | POA: Diagnosis not present

## 2024-03-25 DIAGNOSIS — M25571 Pain in right ankle and joints of right foot: Secondary | ICD-10-CM | POA: Diagnosis not present

## 2024-03-25 DIAGNOSIS — M546 Pain in thoracic spine: Secondary | ICD-10-CM | POA: Diagnosis not present

## 2024-03-25 DIAGNOSIS — M51361 Other intervertebral disc degeneration, lumbar region with lower extremity pain only: Secondary | ICD-10-CM | POA: Diagnosis not present

## 2024-03-25 DIAGNOSIS — M9905 Segmental and somatic dysfunction of pelvic region: Secondary | ICD-10-CM | POA: Diagnosis not present

## 2024-03-26 LAB — SEDIMENTATION RATE: Sed Rate: 27 mm/h (ref 0–40)

## 2024-03-26 LAB — C-REACTIVE PROTEIN: CRP: 2 mg/L (ref 0–10)

## 2024-03-27 DIAGNOSIS — M62830 Muscle spasm of back: Secondary | ICD-10-CM | POA: Diagnosis not present

## 2024-03-27 DIAGNOSIS — M5030 Other cervical disc degeneration, unspecified cervical region: Secondary | ICD-10-CM | POA: Diagnosis not present

## 2024-03-27 DIAGNOSIS — M9905 Segmental and somatic dysfunction of pelvic region: Secondary | ICD-10-CM | POA: Diagnosis not present

## 2024-03-27 DIAGNOSIS — M545 Low back pain, unspecified: Secondary | ICD-10-CM | POA: Diagnosis not present

## 2024-03-27 DIAGNOSIS — M9903 Segmental and somatic dysfunction of lumbar region: Secondary | ICD-10-CM | POA: Diagnosis not present

## 2024-03-27 DIAGNOSIS — G9001 Carotid sinus syncope: Secondary | ICD-10-CM | POA: Diagnosis not present

## 2024-03-27 DIAGNOSIS — M51361 Other intervertebral disc degeneration, lumbar region with lower extremity pain only: Secondary | ICD-10-CM | POA: Diagnosis not present

## 2024-03-27 DIAGNOSIS — M62838 Other muscle spasm: Secondary | ICD-10-CM | POA: Diagnosis not present

## 2024-03-27 DIAGNOSIS — M25562 Pain in left knee: Secondary | ICD-10-CM | POA: Diagnosis not present

## 2024-03-27 DIAGNOSIS — M5416 Radiculopathy, lumbar region: Secondary | ICD-10-CM | POA: Diagnosis not present

## 2024-03-27 DIAGNOSIS — M9901 Segmental and somatic dysfunction of cervical region: Secondary | ICD-10-CM | POA: Diagnosis not present

## 2024-03-27 DIAGNOSIS — M9902 Segmental and somatic dysfunction of thoracic region: Secondary | ICD-10-CM | POA: Diagnosis not present

## 2024-03-27 DIAGNOSIS — M791 Myalgia, unspecified site: Secondary | ICD-10-CM | POA: Diagnosis not present

## 2024-03-27 DIAGNOSIS — M25561 Pain in right knee: Secondary | ICD-10-CM | POA: Diagnosis not present

## 2024-03-27 DIAGNOSIS — M9906 Segmental and somatic dysfunction of lower extremity: Secondary | ICD-10-CM | POA: Diagnosis not present

## 2024-04-01 DIAGNOSIS — M5030 Other cervical disc degeneration, unspecified cervical region: Secondary | ICD-10-CM | POA: Diagnosis not present

## 2024-04-01 DIAGNOSIS — M9903 Segmental and somatic dysfunction of lumbar region: Secondary | ICD-10-CM | POA: Diagnosis not present

## 2024-04-01 DIAGNOSIS — M25561 Pain in right knee: Secondary | ICD-10-CM | POA: Diagnosis not present

## 2024-04-01 DIAGNOSIS — G9001 Carotid sinus syncope: Secondary | ICD-10-CM | POA: Diagnosis not present

## 2024-04-01 DIAGNOSIS — M9902 Segmental and somatic dysfunction of thoracic region: Secondary | ICD-10-CM | POA: Diagnosis not present

## 2024-04-01 DIAGNOSIS — M9906 Segmental and somatic dysfunction of lower extremity: Secondary | ICD-10-CM | POA: Diagnosis not present

## 2024-04-01 DIAGNOSIS — M9905 Segmental and somatic dysfunction of pelvic region: Secondary | ICD-10-CM | POA: Diagnosis not present

## 2024-04-01 DIAGNOSIS — M791 Myalgia, unspecified site: Secondary | ICD-10-CM | POA: Diagnosis not present

## 2024-04-01 DIAGNOSIS — M5416 Radiculopathy, lumbar region: Secondary | ICD-10-CM | POA: Diagnosis not present

## 2024-04-01 DIAGNOSIS — M25562 Pain in left knee: Secondary | ICD-10-CM | POA: Diagnosis not present

## 2024-04-01 DIAGNOSIS — M62838 Other muscle spasm: Secondary | ICD-10-CM | POA: Diagnosis not present

## 2024-04-01 DIAGNOSIS — M51361 Other intervertebral disc degeneration, lumbar region with lower extremity pain only: Secondary | ICD-10-CM | POA: Diagnosis not present

## 2024-04-01 DIAGNOSIS — M9901 Segmental and somatic dysfunction of cervical region: Secondary | ICD-10-CM | POA: Diagnosis not present

## 2024-04-02 ENCOUNTER — Other Ambulatory Visit: Payer: Self-pay | Admitting: Gastroenterology

## 2024-04-02 DIAGNOSIS — K529 Noninfective gastroenteritis and colitis, unspecified: Secondary | ICD-10-CM | POA: Diagnosis not present

## 2024-04-03 DIAGNOSIS — M9906 Segmental and somatic dysfunction of lower extremity: Secondary | ICD-10-CM | POA: Diagnosis not present

## 2024-04-03 DIAGNOSIS — M51361 Other intervertebral disc degeneration, lumbar region with lower extremity pain only: Secondary | ICD-10-CM | POA: Diagnosis not present

## 2024-04-03 DIAGNOSIS — G9001 Carotid sinus syncope: Secondary | ICD-10-CM | POA: Diagnosis not present

## 2024-04-03 DIAGNOSIS — M9903 Segmental and somatic dysfunction of lumbar region: Secondary | ICD-10-CM | POA: Diagnosis not present

## 2024-04-03 DIAGNOSIS — M5030 Other cervical disc degeneration, unspecified cervical region: Secondary | ICD-10-CM | POA: Diagnosis not present

## 2024-04-03 DIAGNOSIS — M791 Myalgia, unspecified site: Secondary | ICD-10-CM | POA: Diagnosis not present

## 2024-04-03 DIAGNOSIS — M9901 Segmental and somatic dysfunction of cervical region: Secondary | ICD-10-CM | POA: Diagnosis not present

## 2024-04-03 DIAGNOSIS — M9902 Segmental and somatic dysfunction of thoracic region: Secondary | ICD-10-CM | POA: Diagnosis not present

## 2024-04-03 DIAGNOSIS — M5416 Radiculopathy, lumbar region: Secondary | ICD-10-CM | POA: Diagnosis not present

## 2024-04-03 DIAGNOSIS — M25561 Pain in right knee: Secondary | ICD-10-CM | POA: Diagnosis not present

## 2024-04-03 DIAGNOSIS — M25562 Pain in left knee: Secondary | ICD-10-CM | POA: Diagnosis not present

## 2024-04-03 DIAGNOSIS — M546 Pain in thoracic spine: Secondary | ICD-10-CM | POA: Diagnosis not present

## 2024-04-03 DIAGNOSIS — M9905 Segmental and somatic dysfunction of pelvic region: Secondary | ICD-10-CM | POA: Diagnosis not present

## 2024-04-03 DIAGNOSIS — M62838 Other muscle spasm: Secondary | ICD-10-CM | POA: Diagnosis not present

## 2024-04-05 LAB — CALPROTECTIN, FECAL: Calprotectin, Fecal: 176 ug/g — ABNORMAL HIGH (ref 0–120)

## 2024-04-08 DIAGNOSIS — M5030 Other cervical disc degeneration, unspecified cervical region: Secondary | ICD-10-CM | POA: Diagnosis not present

## 2024-04-08 DIAGNOSIS — M25561 Pain in right knee: Secondary | ICD-10-CM | POA: Diagnosis not present

## 2024-04-08 DIAGNOSIS — M791 Myalgia, unspecified site: Secondary | ICD-10-CM | POA: Diagnosis not present

## 2024-04-08 DIAGNOSIS — M9901 Segmental and somatic dysfunction of cervical region: Secondary | ICD-10-CM | POA: Diagnosis not present

## 2024-04-08 DIAGNOSIS — M62838 Other muscle spasm: Secondary | ICD-10-CM | POA: Diagnosis not present

## 2024-04-08 DIAGNOSIS — M9906 Segmental and somatic dysfunction of lower extremity: Secondary | ICD-10-CM | POA: Diagnosis not present

## 2024-04-08 DIAGNOSIS — M5416 Radiculopathy, lumbar region: Secondary | ICD-10-CM | POA: Diagnosis not present

## 2024-04-08 DIAGNOSIS — M51361 Other intervertebral disc degeneration, lumbar region with lower extremity pain only: Secondary | ICD-10-CM | POA: Diagnosis not present

## 2024-04-08 DIAGNOSIS — M9903 Segmental and somatic dysfunction of lumbar region: Secondary | ICD-10-CM | POA: Diagnosis not present

## 2024-04-08 DIAGNOSIS — G9001 Carotid sinus syncope: Secondary | ICD-10-CM | POA: Diagnosis not present

## 2024-04-08 DIAGNOSIS — M25562 Pain in left knee: Secondary | ICD-10-CM | POA: Diagnosis not present

## 2024-04-08 DIAGNOSIS — M9905 Segmental and somatic dysfunction of pelvic region: Secondary | ICD-10-CM | POA: Diagnosis not present

## 2024-04-08 DIAGNOSIS — M9902 Segmental and somatic dysfunction of thoracic region: Secondary | ICD-10-CM | POA: Diagnosis not present

## 2024-04-10 DIAGNOSIS — M25561 Pain in right knee: Secondary | ICD-10-CM | POA: Diagnosis not present

## 2024-04-10 DIAGNOSIS — M5416 Radiculopathy, lumbar region: Secondary | ICD-10-CM | POA: Diagnosis not present

## 2024-04-10 DIAGNOSIS — M25562 Pain in left knee: Secondary | ICD-10-CM | POA: Diagnosis not present

## 2024-04-10 DIAGNOSIS — M9905 Segmental and somatic dysfunction of pelvic region: Secondary | ICD-10-CM | POA: Diagnosis not present

## 2024-04-10 DIAGNOSIS — M51361 Other intervertebral disc degeneration, lumbar region with lower extremity pain only: Secondary | ICD-10-CM | POA: Diagnosis not present

## 2024-04-10 DIAGNOSIS — M5030 Other cervical disc degeneration, unspecified cervical region: Secondary | ICD-10-CM | POA: Diagnosis not present

## 2024-04-10 DIAGNOSIS — M546 Pain in thoracic spine: Secondary | ICD-10-CM | POA: Diagnosis not present

## 2024-04-10 DIAGNOSIS — M9903 Segmental and somatic dysfunction of lumbar region: Secondary | ICD-10-CM | POA: Diagnosis not present

## 2024-04-10 DIAGNOSIS — M62838 Other muscle spasm: Secondary | ICD-10-CM | POA: Diagnosis not present

## 2024-04-10 DIAGNOSIS — G9001 Carotid sinus syncope: Secondary | ICD-10-CM | POA: Diagnosis not present

## 2024-04-10 DIAGNOSIS — M791 Myalgia, unspecified site: Secondary | ICD-10-CM | POA: Diagnosis not present

## 2024-04-10 DIAGNOSIS — M62830 Muscle spasm of back: Secondary | ICD-10-CM | POA: Diagnosis not present

## 2024-04-10 DIAGNOSIS — M9901 Segmental and somatic dysfunction of cervical region: Secondary | ICD-10-CM | POA: Diagnosis not present

## 2024-04-10 DIAGNOSIS — M9906 Segmental and somatic dysfunction of lower extremity: Secondary | ICD-10-CM | POA: Diagnosis not present

## 2024-04-10 DIAGNOSIS — M9902 Segmental and somatic dysfunction of thoracic region: Secondary | ICD-10-CM | POA: Diagnosis not present

## 2024-04-14 ENCOUNTER — Ambulatory Visit: Payer: Self-pay | Admitting: Gastroenterology

## 2024-04-15 DIAGNOSIS — M545 Low back pain, unspecified: Secondary | ICD-10-CM | POA: Diagnosis not present

## 2024-04-15 DIAGNOSIS — M62830 Muscle spasm of back: Secondary | ICD-10-CM | POA: Diagnosis not present

## 2024-04-15 DIAGNOSIS — M5416 Radiculopathy, lumbar region: Secondary | ICD-10-CM | POA: Diagnosis not present

## 2024-04-15 DIAGNOSIS — M546 Pain in thoracic spine: Secondary | ICD-10-CM | POA: Diagnosis not present

## 2024-04-15 DIAGNOSIS — M9906 Segmental and somatic dysfunction of lower extremity: Secondary | ICD-10-CM | POA: Diagnosis not present

## 2024-04-15 DIAGNOSIS — M791 Myalgia, unspecified site: Secondary | ICD-10-CM | POA: Diagnosis not present

## 2024-04-15 DIAGNOSIS — M9902 Segmental and somatic dysfunction of thoracic region: Secondary | ICD-10-CM | POA: Diagnosis not present

## 2024-04-15 DIAGNOSIS — G9001 Carotid sinus syncope: Secondary | ICD-10-CM | POA: Diagnosis not present

## 2024-04-15 DIAGNOSIS — M62838 Other muscle spasm: Secondary | ICD-10-CM | POA: Diagnosis not present

## 2024-04-15 DIAGNOSIS — M25561 Pain in right knee: Secondary | ICD-10-CM | POA: Diagnosis not present

## 2024-04-15 DIAGNOSIS — M25562 Pain in left knee: Secondary | ICD-10-CM | POA: Diagnosis not present

## 2024-04-15 DIAGNOSIS — M9901 Segmental and somatic dysfunction of cervical region: Secondary | ICD-10-CM | POA: Diagnosis not present

## 2024-04-15 DIAGNOSIS — M9903 Segmental and somatic dysfunction of lumbar region: Secondary | ICD-10-CM | POA: Diagnosis not present

## 2024-04-15 DIAGNOSIS — M9905 Segmental and somatic dysfunction of pelvic region: Secondary | ICD-10-CM | POA: Diagnosis not present

## 2024-04-15 DIAGNOSIS — M51361 Other intervertebral disc degeneration, lumbar region with lower extremity pain only: Secondary | ICD-10-CM | POA: Diagnosis not present

## 2024-04-15 DIAGNOSIS — M5030 Other cervical disc degeneration, unspecified cervical region: Secondary | ICD-10-CM | POA: Diagnosis not present

## 2024-04-16 DIAGNOSIS — M9906 Segmental and somatic dysfunction of lower extremity: Secondary | ICD-10-CM | POA: Diagnosis not present

## 2024-04-16 DIAGNOSIS — M9903 Segmental and somatic dysfunction of lumbar region: Secondary | ICD-10-CM | POA: Diagnosis not present

## 2024-04-16 DIAGNOSIS — M25561 Pain in right knee: Secondary | ICD-10-CM | POA: Diagnosis not present

## 2024-04-16 DIAGNOSIS — M5416 Radiculopathy, lumbar region: Secondary | ICD-10-CM | POA: Diagnosis not present

## 2024-04-16 DIAGNOSIS — G9001 Carotid sinus syncope: Secondary | ICD-10-CM | POA: Diagnosis not present

## 2024-04-16 DIAGNOSIS — M25562 Pain in left knee: Secondary | ICD-10-CM | POA: Diagnosis not present

## 2024-04-16 DIAGNOSIS — M62830 Muscle spasm of back: Secondary | ICD-10-CM | POA: Diagnosis not present

## 2024-04-16 DIAGNOSIS — M51361 Other intervertebral disc degeneration, lumbar region with lower extremity pain only: Secondary | ICD-10-CM | POA: Diagnosis not present

## 2024-04-16 DIAGNOSIS — M9901 Segmental and somatic dysfunction of cervical region: Secondary | ICD-10-CM | POA: Diagnosis not present

## 2024-04-16 DIAGNOSIS — M5030 Other cervical disc degeneration, unspecified cervical region: Secondary | ICD-10-CM | POA: Diagnosis not present

## 2024-04-16 DIAGNOSIS — M9902 Segmental and somatic dysfunction of thoracic region: Secondary | ICD-10-CM | POA: Diagnosis not present

## 2024-04-16 DIAGNOSIS — M9905 Segmental and somatic dysfunction of pelvic region: Secondary | ICD-10-CM | POA: Diagnosis not present

## 2024-04-16 DIAGNOSIS — M545 Low back pain, unspecified: Secondary | ICD-10-CM | POA: Diagnosis not present

## 2024-04-16 DIAGNOSIS — M62838 Other muscle spasm: Secondary | ICD-10-CM | POA: Diagnosis not present

## 2024-04-16 DIAGNOSIS — M791 Myalgia, unspecified site: Secondary | ICD-10-CM | POA: Diagnosis not present

## 2024-04-18 DIAGNOSIS — N281 Cyst of kidney, acquired: Secondary | ICD-10-CM | POA: Diagnosis not present

## 2024-04-22 DIAGNOSIS — M791 Myalgia, unspecified site: Secondary | ICD-10-CM | POA: Diagnosis not present

## 2024-04-22 DIAGNOSIS — M51361 Other intervertebral disc degeneration, lumbar region with lower extremity pain only: Secondary | ICD-10-CM | POA: Diagnosis not present

## 2024-04-22 DIAGNOSIS — G9001 Carotid sinus syncope: Secondary | ICD-10-CM | POA: Diagnosis not present

## 2024-04-22 DIAGNOSIS — M5416 Radiculopathy, lumbar region: Secondary | ICD-10-CM | POA: Diagnosis not present

## 2024-04-22 DIAGNOSIS — M545 Low back pain, unspecified: Secondary | ICD-10-CM | POA: Diagnosis not present

## 2024-04-22 DIAGNOSIS — M62838 Other muscle spasm: Secondary | ICD-10-CM | POA: Diagnosis not present

## 2024-04-22 DIAGNOSIS — M25562 Pain in left knee: Secondary | ICD-10-CM | POA: Diagnosis not present

## 2024-04-22 DIAGNOSIS — M9903 Segmental and somatic dysfunction of lumbar region: Secondary | ICD-10-CM | POA: Diagnosis not present

## 2024-04-22 DIAGNOSIS — M9905 Segmental and somatic dysfunction of pelvic region: Secondary | ICD-10-CM | POA: Diagnosis not present

## 2024-04-22 DIAGNOSIS — M9902 Segmental and somatic dysfunction of thoracic region: Secondary | ICD-10-CM | POA: Diagnosis not present

## 2024-04-22 DIAGNOSIS — M62830 Muscle spasm of back: Secondary | ICD-10-CM | POA: Diagnosis not present

## 2024-04-22 DIAGNOSIS — M9906 Segmental and somatic dysfunction of lower extremity: Secondary | ICD-10-CM | POA: Diagnosis not present

## 2024-04-22 DIAGNOSIS — M25561 Pain in right knee: Secondary | ICD-10-CM | POA: Diagnosis not present

## 2024-04-22 DIAGNOSIS — M5030 Other cervical disc degeneration, unspecified cervical region: Secondary | ICD-10-CM | POA: Diagnosis not present

## 2024-04-22 DIAGNOSIS — M9901 Segmental and somatic dysfunction of cervical region: Secondary | ICD-10-CM | POA: Diagnosis not present

## 2024-04-24 DIAGNOSIS — M9905 Segmental and somatic dysfunction of pelvic region: Secondary | ICD-10-CM | POA: Diagnosis not present

## 2024-04-24 DIAGNOSIS — M9903 Segmental and somatic dysfunction of lumbar region: Secondary | ICD-10-CM | POA: Diagnosis not present

## 2024-04-24 DIAGNOSIS — G9001 Carotid sinus syncope: Secondary | ICD-10-CM | POA: Diagnosis not present

## 2024-04-24 DIAGNOSIS — M791 Myalgia, unspecified site: Secondary | ICD-10-CM | POA: Diagnosis not present

## 2024-04-24 DIAGNOSIS — M545 Low back pain, unspecified: Secondary | ICD-10-CM | POA: Diagnosis not present

## 2024-04-24 DIAGNOSIS — M9901 Segmental and somatic dysfunction of cervical region: Secondary | ICD-10-CM | POA: Diagnosis not present

## 2024-04-24 DIAGNOSIS — M542 Cervicalgia: Secondary | ICD-10-CM | POA: Diagnosis not present

## 2024-04-24 DIAGNOSIS — M25561 Pain in right knee: Secondary | ICD-10-CM | POA: Diagnosis not present

## 2024-04-24 DIAGNOSIS — M5030 Other cervical disc degeneration, unspecified cervical region: Secondary | ICD-10-CM | POA: Diagnosis not present

## 2024-04-24 DIAGNOSIS — M9902 Segmental and somatic dysfunction of thoracic region: Secondary | ICD-10-CM | POA: Diagnosis not present

## 2024-04-24 DIAGNOSIS — M546 Pain in thoracic spine: Secondary | ICD-10-CM | POA: Diagnosis not present

## 2024-04-24 DIAGNOSIS — M5416 Radiculopathy, lumbar region: Secondary | ICD-10-CM | POA: Diagnosis not present

## 2024-04-24 DIAGNOSIS — M51361 Other intervertebral disc degeneration, lumbar region with lower extremity pain only: Secondary | ICD-10-CM | POA: Diagnosis not present

## 2024-04-24 DIAGNOSIS — M62838 Other muscle spasm: Secondary | ICD-10-CM | POA: Diagnosis not present

## 2024-04-24 DIAGNOSIS — M9906 Segmental and somatic dysfunction of lower extremity: Secondary | ICD-10-CM | POA: Diagnosis not present

## 2024-04-24 DIAGNOSIS — M25562 Pain in left knee: Secondary | ICD-10-CM | POA: Diagnosis not present

## 2024-05-03 DIAGNOSIS — H04123 Dry eye syndrome of bilateral lacrimal glands: Secondary | ICD-10-CM | POA: Diagnosis not present

## 2024-05-03 DIAGNOSIS — H35373 Puckering of macula, bilateral: Secondary | ICD-10-CM | POA: Diagnosis not present

## 2024-05-03 DIAGNOSIS — H26492 Other secondary cataract, left eye: Secondary | ICD-10-CM | POA: Diagnosis not present

## 2024-05-13 DIAGNOSIS — N281 Cyst of kidney, acquired: Secondary | ICD-10-CM | POA: Diagnosis not present

## 2024-06-03 ENCOUNTER — Encounter: Payer: Self-pay | Admitting: Gastroenterology

## 2024-06-03 ENCOUNTER — Ambulatory Visit (INDEPENDENT_AMBULATORY_CARE_PROVIDER_SITE_OTHER): Admitting: Gastroenterology

## 2024-06-03 VITALS — BP 127/80 | HR 66 | Temp 97.9°F | Ht 62.0 in | Wt 158.4 lb

## 2024-06-03 DIAGNOSIS — K5190 Ulcerative colitis, unspecified, without complications: Secondary | ICD-10-CM | POA: Diagnosis not present

## 2024-06-03 DIAGNOSIS — K5900 Constipation, unspecified: Secondary | ICD-10-CM

## 2024-06-03 DIAGNOSIS — K529 Noninfective gastroenteritis and colitis, unspecified: Secondary | ICD-10-CM

## 2024-06-03 MED ORDER — CHOLESTYRAMINE 4 G PO PACK: 4.0000 g | PACK | Freq: Every day | ORAL | 11 refills | Status: AC

## 2024-06-03 NOTE — Patient Instructions (Signed)
 Continue cholestyramine  powder daily as needed to control loose stools. Do not take within 2 hours of other medications.  We will see you back in May 2026, plan for colonoscopy after that visit.   If you have persisting abdominal pain, difficulty controlling stools, blood in the stool, please let me know.

## 2024-06-03 NOTE — Progress Notes (Addendum)
 "    GI Office Note    Referring Provider: Maree Isles, MD Primary Care Physician:  Karen Isles, MD  Primary Gastroenterologist: Karen Simpson. Cindie, DO   Chief Complaint   Chief Complaint  Patient presents with   Follow-up    Doing well, no issues    History of Present Illness   Karen Simpson is a 73 y.o. female presenting today for follow up for IBD. Last seen 03/2024.   Pertinent history:  Originally diagnosed with UC, previously followed by Dr. Gaylia. Was on sulfasalazine in the past though this was discontinued.     Colonoscopy 06/2016: hemorrhoids, inflammation (erythema, edema, micro-ulcerations, more in the proximal 2/3 of the colon). Path nonspecific chronic inflammation of cecum and rectum.    Per OV note: patient treated with welchol, questran . Symptoms improved on mesalamine. Advised to stay on Azulfidine 2gm/day with folic acid .    Reports her sister had colon cancer in the past.   Colonoscopy March 2022 which showed internal hemorrhoids, diverticulosis of the sigmoid descending colon, colon mucosa otherwise unremarkable.  Colon biopsies did show focal active colitis.   Patient was doing well until onset of diarrhea January 2024.  GI pathogen panel positive for norovirus.   Colonoscopy August 16, 2022. She had nonbleeding internal hemorrhoids. Diverticulosis. No endoscopic evidence of inflammation in the entire colon. Normal terminal ileum. Segmental colon biopsies showed mild inflammation of sigmoid colon, some of the chronic features (granulomas) more consistent with Crohn's colitis and not ulcerative colitis.  Biopsies of the cecum, transverse, descending colons showed mildly altered crypt architecture and granulomas.SABRA Special stains negative for acid-fast bacilli or fungi.    Subsequent IBD panel negative. ACE negative.    Discussed the use of AI scribe software for clinical note transcription with the patient, who gave verbal consent to proceed.  History  of Present Illness   She notes some constipation recently in setting of eating too many sweets. This happens to her every time around her birthday and holidays. She is starting back watching her diet, to manage her weight. She typically holds questran  when she is having constipation and restarts if stools become loose. Typically has BM once daily. She denies blood in stool, abdominal pain, or recent diarrhea.   Recent stool calprotectin since last visit was mildly elevated at 176 (normal <120). Blood tests (sed rate, CRP) for inflammation were negative. She goes for her physical with PCP next month and will have routine labs.   No UGI symptoms.     Prior Data     Results    03/2024: fecal calprotectin 176, CRP 2, sed rate 27  Wt Readings from Last 3 Encounters:  06/03/24 158 lb 6.4 oz (71.8 kg)  03/18/24 161 lb (73 kg)  08/23/23 168 lb 6.4 oz (76.4 kg)      Medications   Current Outpatient Medications  Medication Sig Dispense Refill   Ascorbic Acid (VITAMIN C) 1000 MG tablet Take 1,000 mg by mouth daily.     aspirin EC 81 MG tablet Take 81 mg by mouth daily. Swallow whole.     Calcium Carb-Cholecalciferol (CALCIUM 600 + D PO) Take 2 tablets by mouth daily.     cholestyramine  (QUESTRAN ) 4 g packet Take 1 packet (4 g total) by mouth daily. 30 packet 11   CRANBERRY PO Take 15,000 mg by mouth daily.     ELDERBERRY PO Take 50 mg by mouth daily.     hydrocortisone  (ANUSOL -HC) 2.5 % rectal cream Place  1 application rectally 2 (two) times daily. For 14 days. May repeat if needed. Do not use long-term. 30 g 1   meloxicam (MOBIC) 15 MG tablet Take 15 mg by mouth daily.     Multiple Vitamins-Minerals (CENTRUM SILVER 50+WOMEN PO) Take 1 tablet by mouth daily.     Probiotic Product (ALIGN PO) Take 1 capsule by mouth daily.     vitamin E 180 MG (400 UNITS) capsule Take 400 Units by mouth daily.     No current facility-administered medications for this visit.    Allergies   Allergies  as of 06/03/2024 - Review Complete 06/03/2024  Allergen Reaction Noted   Ciprofloxacin  09/07/2021   Flagyl [metronidazole]  09/07/2021      Review of Systems   General: Negative for anorexia, weight loss, fever, chills, fatigue, weakness. ENT: Negative for hoarseness, difficulty swallowing , nasal congestion. CV: Negative for chest pain, angina, palpitations, dyspnea on exertion, peripheral edema.  Respiratory: Negative for dyspnea at rest, dyspnea on exertion, cough, sputum, wheezing.  GI: See history of present illness. GU:  Negative for dysuria, hematuria, urinary incontinence, urinary frequency, nocturnal urination.  Endo: Negative for unusual weight change.     Physical Exam   BP 127/80   Pulse 66   Temp 97.9 F (36.6 C) (Oral)   Ht 5' 2 (1.575 m)   Wt 158 lb 6.4 oz (71.8 kg)   SpO2 98%   BMI 28.97 kg/m    General: Well-nourished, well-developed in no acute distress.  Eyes: No icterus. Mouth: Oropharyngeal mucosa moist and pink   Abdomen: Bowel sounds are normal, nontender, nondistended, no hepatosplenomegaly or masses,  no abdominal bruits or hernia , no rebound or guarding.  Rectal: not performed Extremities: No lower extremity edema. No clubbing or deformities. Neuro: Alert and oriented x 4   Skin: Warm and dry, no jaundice.   Psych: Alert and cooperative, normal mood and affect.  Labs   See above  Imaging Studies   No results found.  Assessment/Plan:   Assessment & Plan Chronic colitis, UC diagnosed initially by outside provider but question of Crohn's colitis on biopsies during last colonoscopy. Asymptomatic at this time. With minimal findings on last colonoscopy, negative IBD panel, she was not started on IBD medications. She has been controlled with cholestyramine  as needed.  - Continue cholestyramine  4g daily as needed.  - Surveillance colonoscopy every two years due to malignancy risk. - Follow-up scheduled in May to reassess clinical status,  will schedule colonoscopy at that time.  - Instructed to contact office if symptoms of flare occur.  Constipation Intermittent, mild, related to diet. Typically holds cholestyramine  when this occurs and restarts when bowels normalized.  - Reinforced withholding cholestyramine  during constipation episodes.  - Advised to report new symptoms such as abdominal pain or hematochezia.    Sonny RAMAN. Ezzard, MHS, PA-C [facility]  "

## 2024-06-13 ENCOUNTER — Encounter: Payer: Self-pay | Admitting: Gastroenterology
